# Patient Record
Sex: Female | Born: 1980
Health system: Southern US, Community
[De-identification: ages and names within clinical notes are randomized; demographics above are authoritative.]

## PROBLEM LIST (undated history)

## (undated) DIAGNOSIS — F32A Depression, unspecified: Secondary | ICD-10-CM

## (undated) DIAGNOSIS — K5190 Ulcerative colitis, unspecified, without complications: Secondary | ICD-10-CM

## (undated) DIAGNOSIS — F419 Anxiety disorder, unspecified: Secondary | ICD-10-CM

## (undated) DIAGNOSIS — F329 Major depressive disorder, single episode, unspecified: Secondary | ICD-10-CM

## (undated) HISTORY — PX: COLONOSCOPY: SHX174

## (undated) HISTORY — PX: OTHER SURGICAL HISTORY: SHX169

## (undated) HISTORY — PX: SIGMOIDOSCOPY: SUR1295

---

## 2013-10-22 LAB — LIPID PANEL
Cholesterol: 193 mg/dL (ref 0–200)
HDL: 42 mg/dL (ref 35–70)
LDL CALC: 138 mg/dL
TRIGLYCERIDES: 115 mg/dL (ref 40–160)

## 2013-11-02 DIAGNOSIS — IMO0002 Reserved for concepts with insufficient information to code with codable children: Secondary | ICD-10-CM | POA: Insufficient documentation

## 2013-11-02 DIAGNOSIS — E559 Vitamin D deficiency, unspecified: Secondary | ICD-10-CM | POA: Insufficient documentation

## 2014-07-11 ENCOUNTER — Encounter: Payer: Self-pay | Admitting: *Deleted

## 2014-07-11 ENCOUNTER — Emergency Department (INDEPENDENT_AMBULATORY_CARE_PROVIDER_SITE_OTHER)
Admission: EM | Admit: 2014-07-11 | Discharge: 2014-07-11 | Disposition: A | Discharge status: Home / Self Care | Payer: BC Managed Care – PPO | Source: Home / Self Care | Attending: Physician Assistant | Admitting: Physician Assistant

## 2014-07-11 DIAGNOSIS — N3000 Acute cystitis without hematuria: Secondary | ICD-10-CM

## 2014-07-11 HISTORY — DX: Ulcerative colitis, unspecified, without complications: K51.90

## 2014-07-11 LAB — POCT URINALYSIS DIP (MANUAL ENTRY)
Bilirubin, UA: NEGATIVE
Glucose, UA: NEGATIVE
Ketones, POC UA: NEGATIVE
NITRITE UA: NEGATIVE
PROTEIN UA: NEGATIVE
RBC UA: NEGATIVE
Spec Grav, UA: 1.02 (ref 1.005–1.03)
Urobilinogen, UA: 0.2 (ref 0–1)
pH, UA: 7.5 (ref 5–8)

## 2014-07-11 MED ORDER — CIPROFLOXACIN HCL 500 MG PO TABS: 500.0000 mg | ORAL_TABLET | Freq: Two times a day (BID) | ORAL | Status: DC

## 2014-07-11 MED ORDER — PHENAZOPYRIDINE HCL 200 MG PO TABS: 200.0000 mg | ORAL_TABLET | Freq: Three times a day (TID) | ORAL | Status: DC | PRN

## 2014-07-11 NOTE — ED Notes (Signed)
Pt c/o dysuria and urinary urgency x today. Denies fever or abd pain.

## 2014-07-11 NOTE — ED Provider Notes (Signed)
CSN: [id number]     Arrival date & time 07/11/14  [id number] History   First MD Initiated Contact with Patient 07/11/14 [phone]     Chief Complaint  Patient presents with  . Urinary Urgency  . Dysuria   (Consider location/radiation/quality/duration/timing/severity/associated sxs/prior Treatment) HPI  Pt presents to the clinic with dysuria and increased urinary urgency since this morning. Denies fever, chills, n/v/d. No abdominal or flank pain. Not tried anything to make better. [facility].   Past Medical History  Diagnosis Date  . Ulcerative colitis    Past Surgical History  Procedure Laterality Date  . Cyst in neck injected with radiation    . Colonoscopy    . Sigmoidoscopy     Family History  Problem Relation Age of Onset  . Rheum arthritis Mother   . Hyperlipidemia Father    History  Substance Use Topics  . Smoking status: Never Smoker   . Smokeless tobacco: Not on file  . Alcohol Use: Yes   OB History    No data available     Review of Systems  All other systems reviewed and are negative.   Allergies  Shellfish allergy  Home Medications   Prior to Admission medications   Medication Sig Start Date End Date Taking? Authorizing Provider  buPROPion (WELLBUTRIN SR) 150 MG 12 hr tablet Take 150 mg by mouth 2 (two) times daily.   Yes Historical Provider, MD  FLUoxetine (PROZAC) 40 MG capsule Take 40 mg by mouth daily.   Yes Historical Provider, MD  mesalamine (LIALDA) 1.2 G EC tablet Take by mouth daily with breakfast.   Yes Historical Provider, MD  UNKNOWN TO PATIENT    Yes Historical Provider, MD  ciprofloxacin (CIPRO) 500 MG tablet Take 1 tablet (500 mg total) by mouth 2 (two) times daily. For 3 days. 07/11/14   Ronniesha Seibold L Regan Llorente, PA-C  phenazopyridine (PYRIDIUM) 200 MG tablet Take 1 tablet (200 mg total) by mouth 3 (three) times daily as needed for pain. 07/11/14   Lakasha Mcfall L Jennefer Kopp, PA-C   BP 108/76 mmHg  Pulse 74   Temp(Src) 98.7 F (37.1 C) (Oral)  Resp 18  Ht 5\' 3"  (1.6 m)  Wt 182 lb (82.555 kg)  BMI 32.25 kg/m2  SpO2 98%  LMP 07/04/2014 Physical Exam  Constitutional: She is oriented to person, place, and time. She appears well-developed and well-nourished.  HENT:  Head: Normocephalic and atraumatic.  Cardiovascular: Normal rate, regular rhythm and normal heart sounds.   Pulmonary/Chest: Effort normal and breath sounds normal.  No CVA tenderness.   Abdominal: Soft. Bowel sounds are normal. She exhibits no distension and no mass. There is no tenderness. There is no rebound and no guarding.  Neurological: She is alert and oriented to person, place, and time.  Skin: Skin is dry.  Psychiatric: She has a normal mood and affect. Her behavior is normal.    ED Course  Procedures (including critical care time) Labs Review Labs Reviewed  URINE CULTURE  POCT URINALYSIS DIP (MANUAL ENTRY)    Imaging Review No results found.   MDM   1. Acute cystitis without hematuria    .[name]. Results for orders placed or performed during the hospital encounter of 07/11/14  POCT urinalysis dipstick (new)  Result Value Ref Range   Color, UA yellow    Clarity, UA clear    Glucose, UA neg    Bilirubin, UA negative    Bilirubin, UA negative    Spec  Grav, UA 1.020 1.005 - 1.03   Blood, UA negative    pH, UA 7.5 5 - 8   Protein Ur, POC negative    Urobilinogen, UA 0.2 0 - 1   Nitrite, UA Negative    Leukocytes, UA moderate (2+)    Will culture. No blood or nitrates, only leukocytes. Dicussed with patient could try pyridium, increased hydration and see if symptoms begin to resolve. If worsening given cipro for 3 days. Follow up as needed. HO given.     Jomarie LongsJade L Prestyn Mahn, PA-C 07/11/14 1011

## 2014-07-11 NOTE — Discharge Instructions (Signed)

## 2014-07-13 LAB — URINE CULTURE
Colony Count: NO GROWTH
Organism ID, Bacteria: NO GROWTH

## 2014-07-14 ENCOUNTER — Telehealth: Payer: Self-pay | Admitting: *Deleted

## 2014-08-04 ENCOUNTER — Encounter: Payer: Self-pay | Admitting: Physician Assistant

## 2014-08-04 ENCOUNTER — Ambulatory Visit (INDEPENDENT_AMBULATORY_CARE_PROVIDER_SITE_OTHER): Payer: 59 | Admitting: Physician Assistant

## 2014-08-04 VITALS — BP 104/72 | HR 70 | Ht 63.0 in | Wt 179.0 lb

## 2014-08-04 DIAGNOSIS — K5190 Ulcerative colitis, unspecified, without complications: Secondary | ICD-10-CM | POA: Insufficient documentation

## 2014-08-04 DIAGNOSIS — R221 Localized swelling, mass and lump, neck: Secondary | ICD-10-CM

## 2014-08-04 DIAGNOSIS — F329 Major depressive disorder, single episode, unspecified: Secondary | ICD-10-CM

## 2014-08-04 DIAGNOSIS — K51919 Ulcerative colitis, unspecified with unspecified complications: Secondary | ICD-10-CM

## 2014-08-04 DIAGNOSIS — F32A Depression, unspecified: Secondary | ICD-10-CM | POA: Insufficient documentation

## 2014-08-04 DIAGNOSIS — F411 Generalized anxiety disorder: Secondary | ICD-10-CM

## 2014-08-04 MED ORDER — FLUOXETINE HCL 40 MG PO CAPS: 40.0000 mg | ORAL_CAPSULE | Freq: Every day | ORAL | Status: DC

## 2014-08-04 MED ORDER — BUPROPION HCL ER (SR) 150 MG PO TB12: 150.0000 mg | ORAL_TABLET | Freq: Every day | ORAL | Status: DC

## 2014-08-04 MED ORDER — NORETHINDRONE ACET-ETHINYL EST 1-20 MG-MCG PO TABS
1.0000 | ORAL_TABLET | Freq: Every day | ORAL | Status: DC
Start: 2014-08-04 — End: 2014-08-04

## 2014-08-04 MED ORDER — NORETHINDRONE ACET-ETHINYL EST 1-20 MG-MCG PO TABS: 1.0000 | ORAL_TABLET | Freq: Every day | ORAL | Status: DC

## 2014-08-04 NOTE — Progress Notes (Signed)
   Subjective:    Patient ID: Adriana Frazier, female    DOB: 06-18-1981, 33 y.o.   MRN: [id number]  HPI Pt is a 33 yo female who presents to the clinic to establish care.   .. Active Ambulatory Problems    Diagnosis Date Noted  . Depression 08/04/2014  . Generalized anxiety disorder 08/04/2014  . Ulcerative colitis 08/04/2014  . Mass of left side of neck 08/04/2014   Resolved Ambulatory Problems    Diagnosis Date Noted  . No Resolved Ambulatory Problems   No Additional Past Medical History   .[name]. Family History  Problem Relation Age of Onset  . Rheum arthritis Mother   . Depression Mother   . Hyperlipidemia Father   . Bipolar disorder Father   . Bipolar disorder Sister   . Bipolar disorder Paternal Uncle    .[name]. History   Social History  . Marital Status: Married    Spouse Name: N/A    Number of Children: N/A  . Years of Education: N/A   Occupational History  . Not on file.   Social History Main Topics  . Smoking status: Never Smoker   . Smokeless tobacco: Not on file  . Alcohol Use: Yes  . Drug Use: No  . Sexual Activity: Yes   Other Topics Concern  . Not on file   Social History Narrative   Pt needs refills on medications. No problems or concerns. Symptoms controlled.    Review of Systems  All other systems reviewed and are negative.      Objective:   Physical Exam  Constitutional: She is oriented to person, place, and time. She appears well-developed and well-nourished.  HENT:  Head: Normocephalic and atraumatic.  Cardiovascular: Normal rate, regular rhythm and normal heart sounds.   Pulmonary/Chest: Effort normal and breath sounds normal.  Abdominal: Soft. Bowel sounds are normal.  Neurological: She is alert and oriented to person, place, and time.  Skin: Skin is dry.  Psychiatric: She has a normal mood and affect. Her behavior is normal.          Assessment & Plan:  Anxiety/depression- PHQ-9 was 1. GAD-7 was 0. Refilled wellbutrin and  prozac for 6 months.   Ulcerative colitis- managed by digestive health.   contraception management- switched from micronor since mini-pill and not as effective. Switched to [employer] Coloestrin. Needs cpe.

## 2014-10-30 ENCOUNTER — Encounter: Payer: Self-pay | Admitting: Physician Assistant

## 2015-02-07 ENCOUNTER — Other Ambulatory Visit (HOSPITAL_COMMUNITY)
Admission: RE | Admit: 2015-02-07 | Discharge: 2015-02-07 | Disposition: A | Discharge status: Home / Self Care | Payer: 59 | Source: Ambulatory Visit | Attending: Family Medicine | Admitting: Family Medicine

## 2015-02-07 ENCOUNTER — Encounter: Payer: Self-pay | Admitting: Family Medicine

## 2015-02-07 ENCOUNTER — Ambulatory Visit (INDEPENDENT_AMBULATORY_CARE_PROVIDER_SITE_OTHER): Payer: 59 | Admitting: Family Medicine

## 2015-02-07 VITALS — BP 100/68 | HR 62 | Ht 63.0 in | Wt 162.0 lb

## 2015-02-07 DIAGNOSIS — Z0000 Encounter for general adult medical examination without abnormal findings: Secondary | ICD-10-CM | POA: Diagnosis not present

## 2015-02-07 DIAGNOSIS — H6122 Impacted cerumen, left ear: Secondary | ICD-10-CM

## 2015-02-07 DIAGNOSIS — Z3009 Encounter for other general counseling and advice on contraception: Secondary | ICD-10-CM

## 2015-02-07 DIAGNOSIS — Z01419 Encounter for gynecological examination (general) (routine) without abnormal findings: Secondary | ICD-10-CM | POA: Diagnosis not present

## 2015-02-07 DIAGNOSIS — F411 Generalized anxiety disorder: Secondary | ICD-10-CM

## 2015-02-07 DIAGNOSIS — Z1151 Encounter for screening for human papillomavirus (HPV): Secondary | ICD-10-CM | POA: Insufficient documentation

## 2015-02-07 DIAGNOSIS — F329 Major depressive disorder, single episode, unspecified: Secondary | ICD-10-CM | POA: Diagnosis not present

## 2015-02-07 DIAGNOSIS — Z124 Encounter for screening for malignant neoplasm of cervix: Secondary | ICD-10-CM

## 2015-02-07 DIAGNOSIS — Z114 Encounter for screening for human immunodeficiency virus [HIV]: Secondary | ICD-10-CM | POA: Diagnosis not present

## 2015-02-07 DIAGNOSIS — F32A Depression, unspecified: Secondary | ICD-10-CM

## 2015-02-07 LAB — COMPLETE METABOLIC PANEL WITH GFR
ALBUMIN: 4.1 g/dL (ref 3.5–5.2)
ALT: 17 U/L (ref 0–35)
AST: 17 U/L (ref 0–37)
Alkaline Phosphatase: 45 U/L (ref 39–117)
BILIRUBIN TOTAL: 0.6 mg/dL (ref 0.2–1.2)
BUN: 8 mg/dL (ref 6–23)
CO2: 27 meq/L (ref 19–32)
Calcium: 9.3 mg/dL (ref 8.4–10.5)
Chloride: 103 mEq/L (ref 96–112)
Creat: 0.79 mg/dL (ref 0.50–1.10)
GFR, Est African American: >89 mL/min
GFR, Est Non African American: >89 mL/min
Glucose, Bld: 84 mg/dL (ref 70–99)
Potassium: 4 mEq/L (ref 3.5–5.3)
Sodium: 138 mEq/L (ref 135–145)
Total Protein: 6.9 g/dL (ref 6.0–8.3)

## 2015-02-07 LAB — LIPID PANEL
CHOL/HDL RATIO: 4.1 ratio
CHOLESTEROL: 239 mg/dL — AB (ref 0–200)
HDL: 58 mg/dL (ref >=46)
LDL CALC: 149 mg/dL — AB (ref 0–99)
Triglycerides: 161 mg/dL — ABNORMAL HIGH (ref <150)
VLDL: 32 mg/dL (ref 0–40)

## 2015-02-07 MED ORDER — FLUOXETINE HCL 40 MG PO CAPS: 40.0000 mg | ORAL_CAPSULE | Freq: Every day | ORAL | Status: DC

## 2015-02-07 MED ORDER — LEVONORGEST-ETH ESTRAD 91-DAY 0.15-0.03 MG PO TABS
1.0000 | ORAL_TABLET | Freq: Every day | ORAL | Status: DC
Start: 2015-02-07 — End: 2015-05-25

## 2015-02-07 MED ORDER — BUPROPION HCL ER (SR) 150 MG PO TB12: 150.0000 mg | ORAL_TABLET | Freq: Every day | ORAL | Status: DC

## 2015-02-07 NOTE — Patient Instructions (Signed)
Keep up a regular exercise program and make sure you are eating a healthy diet Try to eat 4 servings of dairy a day, or if you are lactose intolerant take a calcium with vitamin D daily.  Your vaccines are up to date.   
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
## 2015-02-08 LAB — HIV ANTIBODY (ROUTINE TESTING W REFLEX): HIV 1&2 Ab, 4th Generation: NONREACTIVE

## 2015-02-09 LAB — CYTOLOGY - PAP

## 2015-02-21 ENCOUNTER — Encounter: Payer: Self-pay | Admitting: Emergency Medicine

## 2015-02-21 ENCOUNTER — Emergency Department
Admission: EM | Admit: 2015-02-21 | Discharge: 2015-02-21 | Disposition: A | Discharge status: Home / Self Care | Payer: 59 | Source: Home / Self Care | Attending: Family Medicine | Admitting: Family Medicine

## 2015-02-21 DIAGNOSIS — W57XXXA Bitten or stung by nonvenomous insect and other nonvenomous arthropods, initial encounter: Secondary | ICD-10-CM | POA: Diagnosis not present

## 2015-02-21 DIAGNOSIS — T148 Other injury of unspecified body region: Secondary | ICD-10-CM | POA: Diagnosis not present

## 2015-02-21 DIAGNOSIS — L259 Unspecified contact dermatitis, unspecified cause: Secondary | ICD-10-CM

## 2015-02-21 MED ORDER — TRIAMCINOLONE ACETONIDE 0.1 % EX CREA: 1.0000 "application " | TOPICAL_CREAM | Freq: Two times a day (BID) | CUTANEOUS | Status: DC

## 2015-02-21 NOTE — ED Provider Notes (Signed)
CSN: [id number]     Arrival date & time 02/21/15  0848 History   First MD Initiated Contact with Patient 02/21/15 [phone]     Chief Complaint  Patient presents with  . Rash   (Consider location/radiation/quality/duration/timing/severity/associated sxs/prior Treatment) HPI  Pt is a 34yo female presenting to ED with c/o moderately pruritic rashes that started about 1 week ago after she was outside all day. Pt notes insect bites to her Left lower leg but also a larger rash on her Right forearm.  She has tried OTC creams with minimal relief. Denies fever, chills, n/v/d. No known allergies. Denies rash elsewhere. No contact with others with similar rash.  Past Medical History  Diagnosis Date  . Ulcerative colitis    Past Surgical History  Procedure Laterality Date  . Cyst in neck injected with radiation    . Colonoscopy    . Sigmoidoscopy     Family History  Problem Relation Age of Onset  . Rheum arthritis Mother   . Depression Mother   . Hyperlipidemia Father   . Bipolar disorder Father   . Bipolar disorder Sister   . Bipolar disorder Paternal Uncle   . Colon cancer      grandparents.    History  Substance Use Topics  . Smoking status: Never Smoker   . Smokeless tobacco: Not on file  . Alcohol Use: 0.0 oz/week    0 Standard drinks or equivalent per week     Comment: rarely.    OB History    No data available     Review of Systems  Constitutional: Negative for fever, chills, diaphoresis and fatigue.  Respiratory: Negative for cough, shortness of breath, wheezing and stridor.   Gastrointestinal: Negative for nausea, vomiting and abdominal pain.  Musculoskeletal: Negative for myalgias, joint swelling and arthralgias.  Skin: Positive for rash. Negative for color change, pallor and wound.  Neurological: Negative for weakness and numbness.    Allergies  Shellfish allergy  Home Medications   Prior to Admission medications   Medication Sig Start Date End Date Taking?  Authorizing Provider  buPROPion (WELLBUTRIN SR) 150 MG 12 hr tablet Take 1 tablet (150 mg total) by mouth daily. 02/07/15   Agapito Gamesatherine D Metheney, MD  Cholecalciferol (VITAMIN D-3) 1000 UNITS CAPS Take by mouth daily.    Historical Provider, MD  FLUoxetine (PROZAC) 40 MG capsule Take 1 capsule (40 mg total) by mouth daily. 02/07/15   Agapito Gamesatherine D Metheney, MD  levonorgestrel-ethinyl estradiol (SEASONALE,INTROVALE,JOLESSA) 0.15-0.03 MG tablet Take 1 tablet by mouth daily. 02/07/15   Agapito Gamesatherine D Metheney, MD  mesalamine (LIALDA) 1.2 G EC tablet Take by mouth daily with breakfast. 2 tabs    Historical Provider, MD  Multiple Vitamin (MULTIVITAMIN) tablet Take 1 tablet by mouth daily.    Historical Provider, MD  Omega 3-6-9 Fatty Acids (OMEGA 3-6-9 PO) Take 1,600 mg by mouth daily.    Historical Provider, MD  triamcinolone cream (KENALOG) 0.1 % Apply 1 application topically 2 (two) times daily. 02/21/15   Junius FinnerErin O'Malley, PA-C   BP 105/79 mmHg  Pulse 78  Temp(Src) 98.4 F (36.9 C) (Oral)  Ht 5\' 3"  (1.6 m)  Wt 162 lb (73.483 kg)  BMI 28.70 kg/m2  SpO2 97%  LMP 02/21/2015 Physical Exam  Constitutional: She is oriented to person, place, and time. She appears well-developed and well-nourished.  HENT:  Head: Normocephalic and atraumatic.  Eyes: EOM are normal.  Neck: Normal range of motion.  Cardiovascular: Normal rate.   Pulmonary/Chest: Effort  normal.  Musculoskeletal: Normal range of motion.  Neurological: She is alert and oriented to person, place, and time.  Skin: Skin is warm and dry. Rash noted. There is erythema.  Right forearm, anterior aspect: 2cm diffuse area of erythema and dried vesicles with scant yellow discharge. 1 lone circular 0.5cm dried erythematous lesion proximal to larger rash. No induration or fluctuance. No tenderness. Left lower leg: diffuse erythematous papules. No discharge or bleeding.   Psychiatric: She has a normal mood and affect. Her behavior is normal.  Nursing note  and vitals reviewed.   ED Course  Procedures (including critical care time) Labs Review Labs Reviewed - No data to display  Imaging Review No results found.   MDM   1. Contact dermatitis   2. Insect bites    Pt c/o pruritic rash to Right arm and Left lower leg. No systemic symptoms. Pt is afebrile. No respiratory distress. No evidence of anaphylaxis.  Pt c/o pruritic rash on Right arm and Left lower leg. Rash on Right arm is c/w contact dermatitis, no evidence of underlying infection. Rash on Left lower leg: c/w insect bites, w/o evidence of underlying infection.  No evidence of cellulitis or abscesses. Will prescribe triamcinolone cream as OTC medications not helping. Home care instructions provided. Advised to f/u with PCP in 1 week if symptoms not improving as she may need PO steroids and/or antibiotic if rash becomes infected. Return precautions provided. Pt verbalized understanding and agreement with tx plan.   Junius Finner, PA-C 02/21/15 1115

## 2015-02-21 NOTE — ED Notes (Signed)
Rash on right arm x 1 week Bug bites on lower left leg x 1 week

## 2015-05-25 ENCOUNTER — Ambulatory Visit (INDEPENDENT_AMBULATORY_CARE_PROVIDER_SITE_OTHER): Payer: 59 | Admitting: Advanced Practice Midwife

## 2015-05-25 ENCOUNTER — Encounter: Payer: Self-pay | Admitting: Advanced Practice Midwife

## 2015-05-25 VITALS — BP 95/62 | HR 72 | Wt 164.0 lb

## 2015-05-25 DIAGNOSIS — O09521 Supervision of elderly multigravida, first trimester: Secondary | ICD-10-CM

## 2015-05-25 DIAGNOSIS — Z113 Encounter for screening for infections with a predominantly sexual mode of transmission: Secondary | ICD-10-CM

## 2015-05-25 DIAGNOSIS — O09529 Supervision of elderly multigravida, unspecified trimester: Secondary | ICD-10-CM | POA: Insufficient documentation

## 2015-05-25 DIAGNOSIS — Z3481 Encounter for supervision of other normal pregnancy, first trimester: Secondary | ICD-10-CM | POA: Diagnosis not present

## 2015-05-25 DIAGNOSIS — Z3491 Encounter for supervision of normal pregnancy, unspecified, first trimester: Secondary | ICD-10-CM

## 2015-05-25 NOTE — Progress Notes (Signed)
BABYSCRIPTS PATIENT: [x ] initial,  12,  20,  28,  32,  36,  38,  39,  40 Bedside U/S shows IUP with FHT of 155 BPM and CRL 12.40mm GA [redacted]w[redacted]d

## 2015-05-25 NOTE — Patient Instructions (Signed)
First Trimester of Pregnancy The first trimester of pregnancy is from week 1 until the end of week 12 (months 1 through 3). A week after a sperm fertilizes an egg, the egg will implant on the wall of the uterus. This embryo will begin to develop into a baby. Genes from you and your partner are forming the baby. The female genes determine whether the baby is a boy or a girl. At 6-8 weeks, the eyes and face are formed, and the heartbeat can be seen on ultrasound. At the end of 12 weeks, all the baby's organs are formed.  Now that you are pregnant, you will want to do everything you can to have a healthy baby. Two of the most important things are to get good prenatal care and to follow your health care provider's instructions. Prenatal care is all the medical care you receive before the baby's birth. This care will help prevent, find, and treat any problems during the pregnancy and childbirth. BODY CHANGES Your body goes through many changes during pregnancy. The changes vary from woman to woman.   You may gain or lose a couple of pounds at first.  You may feel sick to your stomach (nauseous) and throw up (vomit). If the vomiting is uncontrollable, call your health care provider.  You may tire easily.  You may develop headaches that can be relieved by medicines approved by your health care provider.  You may urinate more often. Painful urination may mean you have a bladder infection.  You may develop heartburn as a result of your pregnancy.  You may develop constipation because certain hormones are causing the muscles that push waste through your intestines to slow down.  You may develop hemorrhoids or swollen, bulging veins (varicose veins).  Your breasts may begin to grow larger and become tender. Your nipples may stick out more, and the tissue that surrounds them (areola) may become darker.  Your gums may bleed and may be sensitive to brushing and flossing.  Dark spots or blotches (chloasma,  mask of pregnancy) may develop on your face. This will likely fade after the baby is born.  Your menstrual periods will stop.  You may have a loss of appetite.  You may develop cravings for certain kinds of food.  You may have changes in your emotions from day to day, such as being excited to be pregnant or being concerned that something may go wrong with the pregnancy and baby.  You may have more vivid and strange dreams.  You may have changes in your hair. These can include thickening of your hair, rapid growth, and changes in texture. Some women also have hair loss during or after pregnancy, or hair that feels dry or thin. Your hair will most likely return to normal after your baby is born. WHAT TO EXPECT AT YOUR PRENATAL VISITS During a routine prenatal visit:  You will be weighed to make sure you and the baby are growing normally.  Your blood pressure will be taken.  Your abdomen will be measured to track your baby's growth.  The fetal heartbeat will be listened to starting around week 10 or 12 of your pregnancy.  Test results from any previous visits will be discussed. Your health care provider may ask you:  How you are feeling.  If you are feeling the baby move.  If you have had any abnormal symptoms, such as leaking fluid, bleeding, severe headaches, or abdominal cramping.  If you are using any tobacco products,   including cigarettes, chewing tobacco, and electronic cigarettes.  If you have any questions. Other tests that may be performed during your first trimester include:  Blood tests to find your blood type and to check for the presence of any previous infections. They will also be used to check for low iron levels (anemia) and Rh antibodies. Later in the pregnancy, blood tests for diabetes will be done along with other tests if problems develop.  Urine tests to check for infections, diabetes, or protein in the urine.  An ultrasound to confirm the proper growth  and development of the baby.  An amniocentesis to check for possible genetic problems.  Fetal screens for spina bifida and Down syndrome.  You may need other tests to make sure you and the baby are doing well.  HIV (human immunodeficiency virus) testing. Routine prenatal testing includes screening for HIV, unless you choose not to have this test. HOME CARE INSTRUCTIONS  Medicines  Follow your health care provider's instructions regarding medicine use. Specific medicines may be either safe or unsafe to take during pregnancy.  Take your prenatal vitamins as directed.  If you develop constipation, try taking a stool softener if your health care provider approves. Diet  Eat regular, well-balanced meals. Choose a variety of foods, such as meat or vegetable-based protein, fish, milk and low-fat dairy products, vegetables, fruits, and whole grain breads and cereals. Your health care provider will help you determine the amount of weight gain that is right for you.  Avoid raw meat and uncooked cheese. These carry germs that can cause birth defects in the baby.  Eating four or five small meals rather than three large meals a day may help relieve nausea and vomiting. If you start to feel nauseous, eating a few soda crackers can be helpful. Drinking liquids between meals instead of during meals also seems to help nausea and vomiting.  If you develop constipation, eat more high-fiber foods, such as fresh vegetables or fruit and whole grains. Drink enough fluids to keep your urine clear or pale yellow. Activity and Exercise  Exercise only as directed by your health care provider. Exercising will help you:  Control your weight.  Stay in shape.  Be prepared for labor and delivery.  Experiencing pain or cramping in the lower abdomen or low back is a good sign that you should stop exercising. Check with your health care provider before continuing normal exercises.  Try to avoid standing for long  periods of time. Move your legs often if you must stand in one place for a long time.  Avoid heavy lifting.  Wear low-heeled shoes, and practice good posture.  You may continue to have sex unless your health care provider directs you otherwise. Relief of Pain or Discomfort  Wear a good support bra for breast tenderness.   Take warm sitz baths to soothe any pain or discomfort caused by hemorrhoids. Use hemorrhoid cream if your health care provider approves.   Rest with your legs elevated if you have leg cramps or low back pain.  If you develop varicose veins in your legs, wear support hose. Elevate your feet for 15 minutes, 3-4 times a day. Limit salt in your diet. Prenatal Care  Schedule your prenatal visits by the twelfth week of pregnancy. They are usually scheduled monthly at first, then more often in the last 2 months before delivery.  Write down your questions. Take them to your prenatal visits.  Keep all your prenatal visits as directed by your   health care provider. Safety  Wear your seat belt at all times when driving.  Make a list of emergency phone numbers, including numbers for family, friends, the hospital, and police and fire departments. General Tips  Ask your health care provider for a referral to a local prenatal education class. Begin classes no later than at the beginning of month 6 of your pregnancy.  Ask for help if you have counseling or nutritional needs during pregnancy. Your health care provider can offer advice or refer you to specialists for help with various needs.  Do not use hot tubs, steam rooms, or saunas.  Do not douche or use tampons or scented sanitary pads.  Do not cross your legs for long periods of time.  Avoid cat litter boxes and soil used by cats. These carry germs that can cause birth defects in the baby and possibly loss of the fetus by miscarriage or stillbirth.  Avoid all smoking, herbs, alcohol, and medicines not prescribed by  your health care provider. Chemicals in these affect the formation and growth of the baby.  Do not use any tobacco products, including cigarettes, chewing tobacco, and electronic cigarettes. If you need help quitting, ask your health care provider. You may receive counseling support and other resources to help you quit.  Schedule a dentist appointment. At home, brush your teeth with a soft toothbrush and be gentle when you floss. SEEK MEDICAL CARE IF:   You have dizziness.  You have mild pelvic cramps, pelvic pressure, or nagging pain in the abdominal area.  You have persistent nausea, vomiting, or diarrhea.  You have a bad smelling vaginal discharge.  You have pain with urination.  You notice increased swelling in your face, hands, legs, or ankles. SEEK IMMEDIATE MEDICAL CARE IF:   You have a fever.  You are leaking fluid from your vagina.  You have spotting or bleeding from your vagina.  You have severe abdominal cramping or pain.  You have rapid weight gain or loss.  You vomit blood or material that looks like coffee grounds.  You are exposed to German measles and have never had them.  You are exposed to fifth disease or chickenpox.  You develop a severe headache.  You have shortness of breath.  You have any kind of trauma, such as from a fall or a car accident.   This information is not intended to replace advice given to you by your health care provider. Make sure you discuss any questions you have with your health care provider.   Document Released: 07/29/2001 Document Revised: 08/25/2014 Document Reviewed: 06/14/2013 Elsevier Interactive Patient Education 2016 Elsevier Inc.  

## 2015-05-25 NOTE — Progress Notes (Signed)
New OB. See smartset  Subjective:    Adriana Frazier is a G2P1001 [redacted]w[redacted]d being seen today for her first obstetrical visit.  Her obstetrical history is significant for advanced maternal age. Patient does intend to breast feed. Pregnancy history fully reviewed.  Lives in [place], works downstairs as a Paramedic.  Wants to do the [employer].  Patient reports nausea.  Filed Vitals:   05/25/15 1001  BP: 95/62  Pulse: 72  Weight: 74.39 kg (164 lb)    HISTORY: OB History  Gravida Para Term Preterm AB SAB TAB Ectopic Multiple Living  # Outcome Date GA Lbr Len/2nd Weight Sex Delivery Anes PTL Lv  2 Current           1 Term 05/24/12   2.807 kg (6 lb 3 oz) F Vag-Spont EPI       Past Medical History  Diagnosis Date  . Ulcerative colitis [facility])    Past Surgical History  Procedure Laterality Date  . Cyst in neck injected with radiation    . Colonoscopy    . Sigmoidoscopy     Family History  Problem Relation Age of Onset  . Rheum arthritis Mother   . Depression Mother   . Hyperlipidemia Father   . Bipolar disorder Father   . Bipolar disorder Sister   . Bipolar disorder Paternal Uncle   . Colon cancer      grandparents.      Exam    Uterus:  Fundal Height: 7 cm  Pelvic Exam:    Perineum: No Hemorrhoids   Vulva: Bartholin's, Urethra, Skene's normal   Vagina:  normal mucosa, normal discharge   pH:    Cervix: nulliparous appearance   Adnexa: no mass, fullness, tenderness   Bony Pelvis: gynecoid  System: Breast:  normal appearance, no masses or tenderness   Skin: normal coloration and turgor, no rashes    Neurologic: oriented, grossly non-focal   Extremities: normal strength, tone, and muscle mass   HEENT neck supple with midline trachea   Mouth/Teeth mucous membranes moist, pharynx normal without lesions   Neck supple   Cardiovascular: regular rate and rhythm, no murmurs or gallops   Respiratory:  appears well, vitals normal, no respiratory  distress, acyanotic, normal RR, ear and throat exam is normal, neck free of mass or lymphadenopathy, chest clear, no wheezing, crepitations, rhonchi, normal symmetric air entry   Abdomen: soft, non-tender; bowel sounds normal; no masses,  no organomegaly   Urinary: urethral meatus normal      Assessment:    Pregnancy: G2P1001 Patient Active Problem List   Diagnosis Date Noted  . Depression 08/04/2014  . Generalized anxiety disorder 08/04/2014  . Ulcerative colitis (HCC) 08/04/2014  . Mass of left side of neck 08/04/2014        Plan:     Initial labs drawn. Prenatal vitamins. Problem list reviewed and updated. Genetic Screening discussed First Screen and Integrated Screen: Reviewed first screen and Panorama, Undecided.  Ultrasound discussed; fetal survey: results reviewed.  Follow up in 4 weeks. 50% of 30 min visit spent on counseling and coordination of care.   Routines reviewed   Discussed [facility] Discussed waterbirth, pregnancy monitoring, normal first trimester issues. Offered Diclegis, states nausea is not that bad.    [facility] 05/25/2015

## 2015-05-26 LAB — HIV ANTIBODY (ROUTINE TESTING W REFLEX): HIV: NONREACTIVE

## 2015-05-28 ENCOUNTER — Other Ambulatory Visit: Payer: Self-pay | Admitting: Advanced Practice Midwife

## 2015-05-28 LAB — OBSTETRIC PANEL
ANTIBODY SCREEN: NEGATIVE
BASOS PCT: 0 % (ref 0–1)
Basophils Absolute: 0 10*3/uL (ref 0.0–0.1)
EOS ABS: 0.2 10*3/uL (ref 0.0–0.7)
EOS PCT: 2 % (ref 0–5)
HEMATOCRIT: 42.8 % (ref 36.0–46.0)
HEMOGLOBIN: 13.9 g/dL (ref 12.0–15.0)
Hepatitis B Surface Ag: NEGATIVE
LYMPHS ABS: 1.6 10*3/uL (ref 0.7–4.0)
Lymphocytes Relative: 20 % (ref 12–46)
MCH: 29.8 pg (ref 26.0–34.0)
MCHC: 32.5 g/dL (ref 30.0–36.0)
MCV: 91.6 fL (ref 78.0–100.0)
MONO ABS: 0.5 10*3/uL (ref 0.1–1.0)
MONOS PCT: 6 % (ref 3–12)
MPV: 9.5 fL (ref 8.6–12.4)
Neutro Abs: 5.8 10*3/uL (ref 1.7–7.7)
Neutrophils Relative %: 72 % (ref 43–77)
Platelets: 276 10*3/uL (ref 150–400)
RBC: 4.67 MIL/uL (ref 3.87–5.11)
RDW: 13.7 % (ref 11.5–15.5)
RH TYPE: POSITIVE
RUBELLA: 1.63 {index} — AB (ref <0.90)
WBC: 8.1 10*3/uL (ref 4.0–10.5)

## 2015-05-28 LAB — CULTURE, URINE COMPREHENSIVE: Colony Count: 50000

## 2015-05-28 LAB — URINE CYTOLOGY ANCILLARY ONLY
Chlamydia: NEGATIVE
Neisseria Gonorrhea: NEGATIVE

## 2015-05-29 ENCOUNTER — Telehealth: Payer: Self-pay | Admitting: *Deleted

## 2015-05-29 DIAGNOSIS — N390 Urinary tract infection, site not specified: Secondary | ICD-10-CM

## 2015-05-29 MED ORDER — CEPHALEXIN 500 MG PO CAPS: 500.0000 mg | ORAL_CAPSULE | Freq: Four times a day (QID) | ORAL | Status: DC

## 2015-05-29 NOTE — Telephone Encounter (Signed)
-----   Message from Aviva Signs, CNM sent at 05/28/2015  4:17 PM EDT ----- Will prescribe either Keflex or Macrobid. Resistant to Amp.

## 2015-05-29 NOTE — Telephone Encounter (Signed)
Pt notified of urine culture results and RX sent to [facility] for antibiotic

## 2015-06-06 ENCOUNTER — Encounter: Payer: Self-pay | Admitting: Advanced Practice Midwife

## 2015-06-08 ENCOUNTER — Ambulatory Visit (INDEPENDENT_AMBULATORY_CARE_PROVIDER_SITE_OTHER): Payer: 59 | Admitting: Family

## 2015-06-08 VITALS — BP 99/70 | HR 75 | Temp 97.9°F | Wt 160.0 lb

## 2015-06-08 DIAGNOSIS — B3731 Acute candidiasis of vulva and vagina: Secondary | ICD-10-CM

## 2015-06-08 DIAGNOSIS — Z331 Pregnant state, incidental: Secondary | ICD-10-CM

## 2015-06-08 DIAGNOSIS — B373 Candidiasis of vulva and vagina: Secondary | ICD-10-CM

## 2015-06-08 DIAGNOSIS — O09521 Supervision of elderly multigravida, first trimester: Secondary | ICD-10-CM

## 2015-06-08 MED ORDER — TERCONAZOLE 0.4 % VA CREA: 1.0000 | TOPICAL_CREAM | Freq: Every day | VAGINAL | Status: DC

## 2015-06-08 NOTE — Progress Notes (Signed)
Pt here for vaginal itching.  She just finished up Keflex for UTI

## 2015-06-08 NOTE — Progress Notes (Signed)
Subjective:  Adriana PeaSarah Frazier is a 34 y.o. G2P1001 at 1084w4d being seen today for vaginal itching after finishing antibiotic.   Vag. Bleeding: None.  The following portions of the patient's history were reviewed and updated as appropriate: allergies, current medications, past family history, past medical history, past social history, past surgical history and problem list. Problem list updated.  Objective:   Filed Vitals:   06/08/15 0817  BP: 99/70  Pulse: 75  Temp: 97.9 F (36.6 C)  Weight: 160 lb (72.576 kg)    Fetal Status: Fetal Heart Rate (bpm): 178   Movement: Absent     General:  Alert, oriented and cooperative. Patient is in no acute distress.  Skin: Skin is warm and dry. No rash noted.   Cardiovascular: Normal heart rate noted  Respiratory: Normal respiratory effort, no problems with respiration noted  Abdomen: Soft, gravid, appropriate for gestational age. Pain/Pressure: Absent     Pelvic: Vag. Bleeding: None Vag D/C Character: Thin; vaginal area red, white discharge Cervical exam deferred        Extremities: Normal range of motion.  Edema: None  Mental Status: Normal mood and affect. Normal behavior. Normal judgment and thought content.   Urinalysis: Urine Protein: Negative Urine Glucose: Negative  Assessment and Plan:  Pregnancy: G2P1001 at 624w4d  1. AMA (advanced maternal age) multigravida 35+, first trimester - Keep scheduled follow up OB appt.  2. Vaginal yeast infection - WET PREP FOR TRICH, YEAST, CLUE - RX Terazol   Please refer to After Visit Summary for other counseling recommendations.    Eino FarberWalidah Kennith GainN Karim, CNM

## 2015-06-12 LAB — WET PREP, GENITAL
Clue Cells Wet Prep HPF POC: NONE SEEN
TRICH WET PREP: NONE SEEN
WBC WET PREP: NONE SEEN

## 2015-06-25 ENCOUNTER — Ambulatory Visit (INDEPENDENT_AMBULATORY_CARE_PROVIDER_SITE_OTHER): Payer: 59 | Admitting: Advanced Practice Midwife

## 2015-06-25 VITALS — BP 99/65 | HR 79 | Wt 162.0 lb

## 2015-06-25 DIAGNOSIS — O09521 Supervision of elderly multigravida, first trimester: Secondary | ICD-10-CM

## 2015-06-25 DIAGNOSIS — O09891 Supervision of other high risk pregnancies, first trimester: Secondary | ICD-10-CM

## 2015-06-25 NOTE — Progress Notes (Signed)
Subjective:  Adriana PeaSarah Frazier is a 34 y.o. G2P1001 at 8584w0d being seen today for ongoing prenatal care.  Patient reports rash on upper chest. Improving w/ out Tx. Denies any SOB, swelling of lips or toungue. .  Contractions: Not present.  Vag. Bleeding: None. Movement: Absent. Denies leaking of fluid.   The following portions of the patient's history were reviewed and updated as appropriate: allergies, current medications, past family history, past medical history, past social history, past surgical history and problem list. Problem list updated.  Objective:   Filed Vitals:   06/25/15 0827  BP: 99/65  Pulse: 79  Weight: 162 lb (73.483 kg)    Fetal Status: Fetal Heart Rate (bpm): 157   Movement: Absent     General:  Alert, oriented and cooperative. Patient is in no acute distress.  Skin: Skin is warm and dry. No rash noted.   Cardiovascular: Normal heart rate noted  Respiratory: Normal respiratory effort, no problems with respiration noted  Abdomen: Soft, gravid, appropriate for gestational age. Pain/Pressure: Absent     Pelvic: Vag. Bleeding: None Vag D/C Character: Thin   Cervical exam deferred        Extremities: Normal range of motion.  Edema: Trace  Mental Status: Normal mood and affect. Normal behavior. Normal judgment and thought content.   Urinalysis: Urine Protein: Negative Urine Glucose: Negative  Assessment and Plan:  Pregnancy: G2P1001 at 5984w0d  1. AMA (advanced maternal age) multigravida 35+, first trimester -Harmony drawn -Declined first trimester screen - US MFM OB COMP + 14 WK; Future  2. Supervision of other high risk pregnancies, first trimester  - US MFM OB COMP + 14 WK; Future  3. Ultrasound to check fetal anatomy and growth in pregnancy, antepartum  - US MFM OB COMP + 14 WK; Future  First trimester obstetric precautions including but not limited to vaginal bleeding, contractions, leaking of fluid and fetal movement were reviewed in detail with the  patient. Please refer to After Visit Summary for other counseling recommendations.  Return in about 8 weeks (around 08/20/2015).   Dorathy KinsmanVirginia Keriann Rankin, CNM

## 2015-06-25 NOTE — Progress Notes (Signed)
Noticed an itchy type rash on chest over the weekend

## 2015-06-25 NOTE — Patient Instructions (Signed)

## 2015-07-02 LAB — GENETIC SCREENING
TRISOMY 21 (T21): LOW
Trisomy 13 (T13): LOW
Trisomy 18 (T18): LOW

## 2015-07-02 NOTE — Addendum Note (Signed)
Addended by: Arne ClevelandHUTCHINSON, MANDY J on: 07/02/2015 04:36 PM   Modules accepted: Orders

## 2015-07-18 ENCOUNTER — Telehealth: Payer: 59 | Admitting: Family

## 2015-07-18 ENCOUNTER — Ambulatory Visit (INDEPENDENT_AMBULATORY_CARE_PROVIDER_SITE_OTHER): Payer: 59 | Admitting: Osteopathic Medicine

## 2015-07-18 ENCOUNTER — Encounter: Payer: Self-pay | Admitting: Osteopathic Medicine

## 2015-07-18 VITALS — BP 104/73 | HR 80 | Ht 63.0 in | Wt 161.0 lb

## 2015-07-18 DIAGNOSIS — O2341 Unspecified infection of urinary tract in pregnancy, first trimester: Secondary | ICD-10-CM

## 2015-07-18 DIAGNOSIS — R350 Frequency of micturition: Secondary | ICD-10-CM

## 2015-07-18 LAB — POCT URINALYSIS DIPSTICK
BILIRUBIN UA: NEGATIVE
BLOOD: NEGATIVE
GLUCOSE UA: NEGATIVE
Ketones, UA: NEGATIVE
NITRITE UA: NEGATIVE
PH UA: 6.5
Protein, UA: NEGATIVE
SPEC GRAV UA: 1.025
Urobilinogen, UA: 0.2

## 2015-07-18 MED ORDER — NITROFURANTOIN MONOHYD MACRO 100 MG PO CAPS: 100.0000 mg | ORAL_CAPSULE | Freq: Two times a day (BID) | ORAL | Status: DC

## 2015-07-18 NOTE — Patient Instructions (Signed)
We will call you if we need to change antibiotics based on urine culture results. If you have another urinary tract infection, or if her symptoms do not resolve, please let us know or talk to your OB/GYN. Please contact the office if you have any questions. Please finish ALL antibiotics even if you are feeling better!

## 2015-07-18 NOTE — Progress Notes (Signed)
Based on what you shared with me it looks like you have a serious condition that should be evaluated in a face to face office visit. Since you are pregnant you need to go see your primary care provider and have a urine test to rule out an infection. I do not want to give you an unnecessary antibiotic since you are pregnant.   If you are having a true medical emergency please call 911.  If you need an urgent face to face visit, [facility] has four urgent care centers for your convenience.  Adriana Frazier. [facility]  [phone] Get Driving Directions Find a Provider at this Location  [address] [place], [place] [address] . 8 am to 8 pm Monday-Friday . 9 am to 7 pm Saturday-Sunday  . [facility]  [phone] Get Driving Directions Find a Provider at this Location  [address] [place], [place] [address] . 8 am to 8 pm Monday-Friday . 9 am to 6 pm Saturday . 11 am to 6 pm Sunday   . [facility] [place]  [phone] Get Driving Directions  [address] [place], [place] [address] . 8 am to 8 pm Monday-Friday . 9 am to 4 pm Saturday-Sunday   . [facility] (a walk in primary care provider)  [phone]  Get Driving Directions Find a Provider at this Location  [address] [place], [place] [address] . 8 am to 8:30 pm Monday-Thursday . 8 am to 6 pm Friday . 8 am to 4 pm Saturday-Sunday   Your e-visit answers were reviewed by a board certified advanced clinical practitioner to complete your personal care plan.  Thank you for using e-Visits.

## 2015-07-18 NOTE — Progress Notes (Signed)
Chief Complaint: Possible UTI  History of Present Illness: Adriana Frazier is a 34 y.o. female who presents to [facility]  today with concerns for acute urinary tract infection  Onset: 2 days ago  Quality: Burning/Urgency Exacerbating factors:   Frequency: yes  Hematuria: no  Odor: no  Fever/chills: no  Incontinence: no  Flank Pain: no Previous UTI: treated for asymptomatic bacteriuria in pregnancy, other last  Recurrent UTI (3 times/more annually): no Abx in past 3 months: yes  Complicated (any of the following): Yes ?Diabetes ?Pregnancy ?Symptoms for seven or more days before seeking care ?Hospital acquired infection ?Renal failure ?Urinary tract obstruction ?Presence of an indwelling urethral catheter, stent, nephrostomy tube or urinary diversion ?Functional or anatomic abnormality of the urinary tract ?Renal transplantation ?Immunosuppression   Past medical, social and family history reviewed: Past Medical History  Diagnosis Date  . Ulcerative colitis [facility])    Past Surgical History  Procedure Laterality Date  . Cyst in neck injected with radiation    . Colonoscopy    . Sigmoidoscopy     Social History  Substance Use Topics  . Smoking status: Never Smoker   . Smokeless tobacco: Never Used  . Alcohol Use: 0.0 oz/week    0 Standard drinks or equivalent per week     Comment: rarely.    The patient has a family history of  Current Outpatient Prescriptions  Medication Sig Dispense Refill  . buPROPion (WELLBUTRIN SR) 150 MG 12 hr tablet Take 1 tablet (150 mg total) by mouth daily. 90 tablet 1  . Cholecalciferol (VITAMIN D-3) 1000 UNITS CAPS Take by mouth daily.    [name] FLUoxetine (PROZAC) 40 MG capsule Take 1 capsule (40 mg total) by mouth daily. 90 capsule 1  . mesalamine (LIALDA) 1.2 G EC tablet Take by mouth daily with breakfast. 2 tabs    . Omega 3-6-9 Fatty Acids (OMEGA 3-6-9 PO) Take 1,600 mg by mouth daily.    . Prenatal  Vit-Fe Fumarate-FA (PRENATAL VITAMIN PO) Take by mouth.     No current facility-administered medications for this visit.   Allergies  Allergen Reactions  . Shellfish Allergy      Review of Systems: CONSTITUTIONAL: Negative fever/chills CARDIAC: No chest pain/pressure/palpitations, no orthopnea RESPIRATORY: No cough/shortness of breath/wheeze GASTROINTESTINAL: No nausea/vomiting/abdominal pain/blood in stool/diarrhea/constipation MUSCULOSKELETAL: No myalgia/arthralgia/back pain GENITOURINARY: Negative incontinence, Negative abnormal genital bleeding/discharge. (+)Dysuria/UTI symptoms as per HPI   Exam:  Filed Vitals:   07/18/15 1111  Height:  (1.6 m)  Weight: 161 lb (73.029 kg)   Constitutional: VSS, see above. General Appearance: alert, well-developed, well-nourished, NAD Respiratory: Normal respiratory effort. Breath sounds normal, no wheeze/rhonchi/rales Cardiovascular: S1/S2 normal, no murmur/rub/gallop auscultated. RRR Gastrointestinal: Nontender, no masses. No hepatomegaly, no splenomegaly. No hernia appreciated. Rectal exam deferred.  Musculoskeletal: Gait normal. No clubbing/cyanosis of digits. Lloyd sign Negative bilateral  Results for orders placed or performed in visit on 07/18/15 (from the past 48 hour(s))  POCT Urinalysis Dipstick     Status: Abnormal   Collection Time: 07/18/15 11:09 AM  Result Value Ref Range   Color, UA YELLOW    Clarity, UA CLEAR    Glucose, UA NEG    Bilirubin, UA NEG    Ketones, UA NEG    Spec Grav, UA 1.025    Blood NEG    pH, UA 6.5    Protein, UA NEG    Urobilinogen, UA 0.2    Nitrite, UA NEG    Leukocytes, UA small (  1+) (A) Negative    Reviewed previous culture results done for pregnancy screening: treated for E.Coli 50K colonies/mL w/ Keflex  ASSESSMENT/PLAN: Urinary tract infection in pregnancy and, patient states this is her second UTI this year, previously treated for asymptomatic bacteriuria when Escherichia coli  found on screening urine culture done as part of routine prenatal care. Patient advised to call in antibiotics even if feeling better. Please let us for her OB/GYN know if she is not improving or gets another urinary tract infection . If recurs, may consider prophylactic antibiotics.   UTI in pregnancy, first trimester - Plan: nitrofurantoin, macrocrystal-monohydrate, (MACROBID) 100 MG capsule  Frequency of urination - Plan: POCT Urinalysis Dipstick   Return if symptoms worsen or fail to improve, and routine prenatal care with OBGYN.

## 2015-07-21 LAB — URINE CULTURE

## 2015-07-26 ENCOUNTER — Encounter: Payer: Self-pay | Admitting: Obstetrics & Gynecology

## 2015-08-06 ENCOUNTER — Encounter: Payer: Self-pay | Admitting: Physician Assistant

## 2015-08-06 ENCOUNTER — Ambulatory Visit (INDEPENDENT_AMBULATORY_CARE_PROVIDER_SITE_OTHER): Payer: 59 | Admitting: Physician Assistant

## 2015-08-06 VITALS — BP 96/61 | HR 77 | Ht 63.0 in | Wt 163.0 lb

## 2015-08-06 DIAGNOSIS — F329 Major depressive disorder, single episode, unspecified: Secondary | ICD-10-CM | POA: Diagnosis not present

## 2015-08-06 DIAGNOSIS — F32A Depression, unspecified: Secondary | ICD-10-CM

## 2015-08-06 DIAGNOSIS — F411 Generalized anxiety disorder: Secondary | ICD-10-CM | POA: Diagnosis not present

## 2015-08-06 MED ORDER — FLUOXETINE HCL 40 MG PO CAPS: 40.0000 mg | ORAL_CAPSULE | Freq: Every day | ORAL | Status: DC

## 2015-08-06 MED ORDER — BUPROPION HCL ER (SR) 150 MG PO TB12: 150.0000 mg | ORAL_TABLET | Freq: Every day | ORAL | Status: DC

## 2015-08-06 NOTE — Progress Notes (Signed)
   Subjective:    Patient ID: Dominic PeaSarah Moretto, female    DOB: Apr 14, 1981, 34 y.o.   MRN: [id number]  HPI Patient is a 34 year old female who presents to the clinic for follow-up on anxiety and depression. Patient is [redacted] weeks pregnant with no complications. She sees womens next door.  She has not changed any of her medication. She feels wonderful. She denies any anxiety or depression that is worsening. She feels very controlled. She denies any suicidal or homicidal thoughts.   Review of Systems  All other systems reviewed and are negative.      Objective:   Physical Exam  Constitutional: She is oriented to person, place, and time. She appears well-developed and well-nourished.  HENT:  Head: Normocephalic and atraumatic.  Cardiovascular: Normal rate, regular rhythm and normal heart sounds.   Neurological: She is alert and oriented to person, place, and time.  Psychiatric: She has a normal mood and affect. Her behavior is normal.          Assessment & Plan:  GAD/Depression- GAD-7 was 1. PHQ-9 was 1. Refilled medications for 1 year. Last cmp was 6 months ago and great. Follow up for CPE.

## 2015-08-10 ENCOUNTER — Ambulatory Visit: Payer: 59 | Admitting: Physician Assistant

## 2015-08-10 ENCOUNTER — Other Ambulatory Visit: Payer: Self-pay | Admitting: Advanced Practice Midwife

## 2015-08-10 ENCOUNTER — Ambulatory Visit (HOSPITAL_COMMUNITY)
Admission: RE | Admit: 2015-08-10 | Discharge: 2015-08-10 | Disposition: A | Discharge status: Home / Self Care | Payer: 59 | Source: Ambulatory Visit | Attending: Advanced Practice Midwife | Admitting: Advanced Practice Midwife

## 2015-08-10 DIAGNOSIS — Z36 Encounter for antenatal screening of mother: Secondary | ICD-10-CM | POA: Diagnosis not present

## 2015-08-10 DIAGNOSIS — Z3689 Encounter for other specified antenatal screening: Secondary | ICD-10-CM

## 2015-08-10 DIAGNOSIS — O09521 Supervision of elderly multigravida, first trimester: Secondary | ICD-10-CM

## 2015-08-10 DIAGNOSIS — Z3A18 18 weeks gestation of pregnancy: Secondary | ICD-10-CM | POA: Diagnosis not present

## 2015-08-10 DIAGNOSIS — O09522 Supervision of elderly multigravida, second trimester: Secondary | ICD-10-CM

## 2015-08-10 DIAGNOSIS — O09891 Supervision of other high risk pregnancies, first trimester: Secondary | ICD-10-CM

## 2015-08-10 DIAGNOSIS — O4450 Low lying placenta with hemorrhage, unspecified trimester: Secondary | ICD-10-CM

## 2015-08-11 DIAGNOSIS — O4450 Low lying placenta with hemorrhage, unspecified trimester: Secondary | ICD-10-CM | POA: Insufficient documentation

## 2015-08-15 ENCOUNTER — Telehealth: Payer: Self-pay | Admitting: *Deleted

## 2015-08-15 NOTE — Telephone Encounter (Signed)
-----   Message from [place], [place] sent at 08/11/2015  3:27 PM EST ----- Please inform of low lying placenta. Pelvic rest until resolved.

## 2015-08-15 NOTE — Telephone Encounter (Signed)
LM on voicemail with a f/u on latest OB u/s.  The u/s showed a low lying placenta and encourage pelvic rest until resolved.  This includes no intercourse and no lifting over 5-10 lbs.

## 2015-08-19 NOTE — L&D Delivery Note (Signed)
Delivery Note At 08:59, a viable female was delivered via precipitous NSVD.  RN called that patient was 6cm and desired epidural and approximately 10 minutes later called that patient was pushing.  Infant was delivered without complication prior to MD arrival.  APGAR: 8, 9; weight 2945g. Placenta status: intact, spontaneous.  Cord: 3-vessel with the following complications: none.  Cord pH: not obtained   Anesthesia: none for delivery, local lidocaine for repair  Episiotomy: none Lacerations: right periurethral Suture Repair: 4-0 SH Est. Blood Loss (mL): 250cc  Mom to postpartum.  Baby to Couplet care / Skin to Skin.  Adriana Frazier 12/12/2015, 9:42 AM

## 2015-08-22 ENCOUNTER — Ambulatory Visit (INDEPENDENT_AMBULATORY_CARE_PROVIDER_SITE_OTHER): Payer: 59 | Admitting: Obstetrics & Gynecology

## 2015-08-22 VITALS — BP 97/63 | HR 82 | Wt 167.0 lb

## 2015-08-22 DIAGNOSIS — Z36 Encounter for antenatal screening of mother: Secondary | ICD-10-CM

## 2015-08-22 DIAGNOSIS — Z3492 Encounter for supervision of normal pregnancy, unspecified, second trimester: Secondary | ICD-10-CM

## 2015-08-22 DIAGNOSIS — Z3482 Encounter for supervision of other normal pregnancy, second trimester: Secondary | ICD-10-CM

## 2015-08-22 DIAGNOSIS — Z3480 Encounter for supervision of other normal pregnancy, unspecified trimester: Secondary | ICD-10-CM | POA: Diagnosis not present

## 2015-08-22 DIAGNOSIS — O4442 Low lying placenta NOS or without hemorrhage, second trimester: Secondary | ICD-10-CM

## 2015-08-22 NOTE — Progress Notes (Signed)
Subjective:  Adriana Frazier is a 35 y.o. G2P1001 at 3569w2d being seen today for ongoing prenatal care.  She is currently monitored for the following issues for this high-risk pregnancy and has Depression; Generalized anxiety disorder; Ulcerative colitis (HCC); Mass of left side of neck; AMA (advanced maternal age) multigravida 35+; Supervision of other high risk pregnancies, first trimester; and Low lying placenta with hemorrhage, antepartum on her problem list.  Patient reports no complaints.  Contractions: Not present. Vag. Bleeding: None.  Movement: Present. Denies leaking of fluid.   The following portions of the patient's history were reviewed and updated as appropriate: allergies, current medications, past family history, past medical history, past social history, past surgical history and problem list. Problem list updated.  Objective:   Filed Vitals:   08/22/15 0857  BP: 97/63  Pulse: 82  Weight: 167 lb (75.751 kg)    Fetal Status: Fetal Heart Rate (bpm): 143   Movement: Present     General:  Alert, oriented and cooperative. Patient is in no acute distress.  Skin: Skin is warm and dry. No rash noted.   Cardiovascular: Normal heart rate noted  Respiratory: Normal respiratory effort, no problems with respiration noted  Abdomen: Soft, gravid, appropriate for gestational age. Pain/Pressure: Absent     Pelvic: Vag. Bleeding: None Vag D/C Character: Thin   Cervical exam deferred        Extremities: Normal range of motion.  Edema: Trace  Mental Status: Normal mood and affect. Normal behavior. Normal judgment and thought content.   Urinalysis: Urine Protein: Negative Urine Glucose: Negative  Assessment and Plan:  Pregnancy: G2P1001 at 6169w2d  1. Supervision of normal pregnancy in second trimester -pt engaged with baby scripts.  Taking BP and weight as directed - US MFM OB FOLLOW UP; Future - Alpha fetoprotein, maternal  2. Low lying placenta nos or without hemorrhage, second  trimester -vag rest until rpt US - US MFM OB FOLLOW UP; Future  Preterm labor symptoms and general obstetric precautions including but not limited to vaginal bleeding, contractions, leaking of fluid and fetal movement were reviewed in detail with the patient. Please refer to After Visit Summary for other counseling recommendations.  Return in about 8 weeks (around 10/17/2015).   Lesly DukesKelly H Nasya Vincent, MD

## 2015-08-22 NOTE — Progress Notes (Signed)
Pt aware of low lying placenta

## 2015-08-23 ENCOUNTER — Telehealth: Payer: Self-pay | Admitting: *Deleted

## 2015-08-23 LAB — ALPHA FETOPROTEIN, MATERNAL
AFP: 92.6 ng/mL
Curr Gest Age: 20.2 wks.days
MoM for AFP: 1.63
Open Spina bifida: NEGATIVE
Osb Risk: 1:1920 {titer}

## 2015-08-23 NOTE — Telephone Encounter (Signed)
LM on voicemail of neg AFP

## 2015-09-05 MED FILL — LIALDA 1.2 GM TABLET SA: 1.2 | 60 days supply | Qty: 120 | Fill #2

## 2015-10-01 ENCOUNTER — Ambulatory Visit (HOSPITAL_COMMUNITY)
Admission: RE | Admit: 2015-10-01 | Discharge: 2015-10-01 | Disposition: A | Discharge status: Home / Self Care | Payer: 59 | Source: Ambulatory Visit | Attending: Obstetrics & Gynecology | Admitting: Obstetrics & Gynecology

## 2015-10-01 DIAGNOSIS — O09522 Supervision of elderly multigravida, second trimester: Secondary | ICD-10-CM | POA: Insufficient documentation

## 2015-10-01 DIAGNOSIS — O4442 Low lying placenta NOS or without hemorrhage, second trimester: Secondary | ICD-10-CM | POA: Insufficient documentation

## 2015-10-01 DIAGNOSIS — O09891 Supervision of other high risk pregnancies, first trimester: Secondary | ICD-10-CM

## 2015-10-01 DIAGNOSIS — Z36 Encounter for antenatal screening of mother: Secondary | ICD-10-CM | POA: Diagnosis not present

## 2015-10-01 DIAGNOSIS — Z3A26 26 weeks gestation of pregnancy: Secondary | ICD-10-CM | POA: Diagnosis not present

## 2015-10-01 DIAGNOSIS — Z3492 Encounter for supervision of normal pregnancy, unspecified, second trimester: Secondary | ICD-10-CM

## 2015-10-15 ENCOUNTER — Ambulatory Visit (INDEPENDENT_AMBULATORY_CARE_PROVIDER_SITE_OTHER): Payer: 59 | Admitting: Advanced Practice Midwife

## 2015-10-15 ENCOUNTER — Encounter: Payer: Self-pay | Admitting: Advanced Practice Midwife

## 2015-10-15 VITALS — BP 106/70 | HR 87 | Wt 176.0 lb

## 2015-10-15 DIAGNOSIS — Z3492 Encounter for supervision of normal pregnancy, unspecified, second trimester: Secondary | ICD-10-CM

## 2015-10-15 DIAGNOSIS — Z36 Encounter for antenatal screening of mother: Secondary | ICD-10-CM

## 2015-10-15 DIAGNOSIS — Z3480 Encounter for supervision of other normal pregnancy, unspecified trimester: Secondary | ICD-10-CM | POA: Diagnosis not present

## 2015-10-15 DIAGNOSIS — F329 Major depressive disorder, single episode, unspecified: Secondary | ICD-10-CM

## 2015-10-15 DIAGNOSIS — Z3482 Encounter for supervision of other normal pregnancy, second trimester: Secondary | ICD-10-CM

## 2015-10-15 DIAGNOSIS — Z23 Encounter for immunization: Secondary | ICD-10-CM | POA: Diagnosis not present

## 2015-10-15 DIAGNOSIS — F32A Depression, unspecified: Secondary | ICD-10-CM

## 2015-10-15 LAB — CBC
HEMATOCRIT: 39.4 % (ref 36.0–46.0)
HEMOGLOBIN: 13.6 g/dL (ref 12.0–15.0)
MCH: 32.7 pg (ref 26.0–34.0)
MCHC: 34.5 g/dL (ref 30.0–36.0)
MCV: 94.7 fL (ref 78.0–100.0)
MPV: 9.6 fL (ref 8.6–12.4)
Platelets: 233 10*3/uL (ref 150–400)
RBC: 4.16 MIL/uL (ref 3.87–5.11)
RDW: 14.2 % (ref 11.5–15.5)
WBC: 8.8 10*3/uL (ref 4.0–10.5)

## 2015-10-15 NOTE — Progress Notes (Signed)
28 wk labs today 

## 2015-10-15 NOTE — Progress Notes (Signed)
Subjective:  Adriana Frazier is a 35 y.o. G2P1001 at [redacted]w[redacted]d being seen today for ongoing prenatal care.  She is currently monitored for the following issues for this low-risk pregnancy and has Depression; Generalized anxiety disorder; Ulcerative colitis (HCC); Mass of left side of neck; AMA (advanced maternal age) multigravida 35+; and Supervision of other high risk pregnancies, first trimester on her problem list.  Patient reports neck spasm, irritability, feeling of dehydration.  Contractions: Not present. Vag. Bleeding: None.  Movement: Present. Denies leaking of fluid.   The following portions of the patient's history were reviewed and updated as appropriate: allergies, current medications, past family history, past medical history, past social history, past surgical history and problem list. Problem list updated.  Objective:   Filed Vitals:   10/15/15 0833  BP: 106/70  Pulse: 87  Weight: 176 lb (79.833 kg)    Fetal Status: Fetal Heart Rate (bpm): 143   Movement: Present     General:  Alert, oriented and cooperative. Patient is in no acute distress.  Skin: Skin is warm and dry. No rash noted.   Cardiovascular: Normal heart rate noted  Respiratory: Normal respiratory effort, no problems with respiration noted  Abdomen: Soft, gravid, appropriate for gestational age. Pain/Pressure: Absent     Pelvic: Vag. Bleeding: None Vag D/C Character: Thin   Cervical exam deferred        Extremities: Normal range of motion.  Edema: Trace  Mental Status: Normal mood and affect. Normal behavior. Normal judgment and thought content.   Urinalysis: Urine Protein: Negative Urine Glucose: Negative  Assessment and Plan:  Pregnancy: G2P1001 at [redacted]w[redacted]d  1. Supervision of normal pregnancy in second trimester      Discussed hydration, drinking to dilute urine color      Discussed sleep hygeine      Glucola today - RPR - HIV antibody - Glucose Tolerance, 1 HR (50g) - CBC - Tdap vaccine greater than or  equal to 7yo IM  2. Depression      Stable on meds, discussed improved sleep habits  Preterm labor symptoms and general obstetric precautions including but not limited to vaginal bleeding, contractions, leaking of fluid and fetal movement were reviewed in detail with the patient. Please refer to After Visit Summary for other counseling recommendations.   RTO 2 weeks  Aviva Signs, CNM

## 2015-10-15 NOTE — Patient Instructions (Signed)
Third Trimester of Pregnancy The third trimester is from week 29 through week 42, months 7 through 9. The third trimester is a time when the fetus is growing rapidly. At the end of the ninth month, the fetus is about 20 inches in length and weighs 6-10 pounds.  BODY CHANGES Your body goes through many changes during pregnancy. The changes vary from woman to woman.   Your weight will continue to increase. You can expect to gain 25-35 pounds (11-16 kg) by the end of the pregnancy.  You may begin to get stretch marks on your hips, abdomen, and breasts.  You may urinate more often because the fetus is moving lower into your pelvis and pressing on your bladder.  You may develop or continue to have heartburn as a result of your pregnancy.  You may develop constipation because certain hormones are causing the muscles that push waste through your intestines to slow down.  You may develop hemorrhoids or swollen, bulging veins (varicose veins).  You may have pelvic pain because of the weight gain and pregnancy hormones relaxing your joints between the bones in your pelvis. Backaches may result from overexertion of the muscles supporting your posture.  You may have changes in your hair. These can include thickening of your hair, rapid growth, and changes in texture. Some women also have hair loss during or after pregnancy, or hair that feels dry or thin. Your hair will most likely return to normal after your baby is born.  Your breasts will continue to grow and be tender. A yellow discharge may leak from your breasts called colostrum.  Your belly button may stick out.  You may feel short of breath because of your expanding uterus.  You may notice the fetus "dropping," or moving lower in your abdomen.  You may have a bloody mucus discharge. This usually occurs a few days to a week before labor begins.  Your cervix becomes thin and soft (effaced) near your due date. WHAT TO EXPECT AT YOUR PRENATAL  EXAMS  You will have prenatal exams every 2 weeks until week 36. Then, you will have weekly prenatal exams. During a routine prenatal visit:  You will be weighed to make sure you and the fetus are growing normally.  Your blood pressure is taken.  Your abdomen will be measured to track your baby's growth.  The fetal heartbeat will be listened to.  Any test results from the previous visit will be discussed.  You may have a cervical check near your due date to see if you have effaced. At around 36 weeks, your caregiver will check your cervix. At the same time, your caregiver will also perform a test on the secretions of the vaginal tissue. This test is to determine if a type of bacteria, Group B streptococcus, is present. Your caregiver will explain this further. Your caregiver may ask you:  What your birth plan is.  How you are feeling.  If you are feeling the baby move.  If you have had any abnormal symptoms, such as leaking fluid, bleeding, severe headaches, or abdominal cramping.  If you are using any tobacco products, including cigarettes, chewing tobacco, and electronic cigarettes.  If you have any questions. Other tests or screenings that may be performed during your third trimester include:  Blood tests that check for low iron levels (anemia).  Fetal testing to check the health, activity level, and growth of the fetus. Testing is done if you have certain medical conditions or if   there are problems during the pregnancy.  HIV (human immunodeficiency virus) testing. If you are at high risk, you may be screened for HIV during your third trimester of pregnancy. FALSE LABOR You may feel small, irregular contractions that eventually go away. These are called Braxton Hicks contractions, or false labor. Contractions may last for hours, days, or even weeks before true labor sets in. If contractions come at regular intervals, intensify, or become painful, it is best to be seen by your  caregiver.  SIGNS OF LABOR   Menstrual-like cramps.  Contractions that are 5 minutes apart or less.  Contractions that start on the top of the uterus and spread down to the lower abdomen and back.  A sense of increased pelvic pressure or back pain.  A watery or bloody mucus discharge that comes from the vagina. If you have any of these signs before the 37th week of pregnancy, call your caregiver right away. You need to go to the hospital to get checked immediately. HOME CARE INSTRUCTIONS   Avoid all smoking, herbs, alcohol, and unprescribed drugs. These chemicals affect the formation and growth of the baby.  Do not use any tobacco products, including cigarettes, chewing tobacco, and electronic cigarettes. If you need help quitting, ask your health care provider. You may receive counseling support and other resources to help you quit.  Follow your caregiver's instructions regarding medicine use. There are medicines that are either safe or unsafe to take during pregnancy.  Exercise only as directed by your caregiver. Experiencing uterine cramps is a good sign to stop exercising.  Continue to eat regular, healthy meals.  Wear a good support bra for breast tenderness.  Do not use hot tubs, steam rooms, or saunas.  Wear your seat belt at all times when driving.  Avoid raw meat, uncooked cheese, cat litter boxes, and soil used by cats. These carry germs that can cause birth defects in the baby.  Take your prenatal vitamins.  Take 1500-2000 mg of calcium daily starting at the 20th week of pregnancy until you deliver your baby.  Try taking a stool softener (if your caregiver approves) if you develop constipation. Eat more high-fiber foods, such as fresh vegetables or fruit and whole grains. Drink plenty of fluids to keep your urine clear or pale yellow.  Take warm sitz baths to soothe any pain or discomfort caused by hemorrhoids. Use hemorrhoid cream if your caregiver approves.  If  you develop varicose veins, wear support hose. Elevate your feet for 15 minutes, 3-4 times a day. Limit salt in your diet.  Avoid heavy lifting, wear low heal shoes, and practice good posture.  Rest a lot with your legs elevated if you have leg cramps or low back pain.  Visit your dentist if you have not gone during your pregnancy. Use a soft toothbrush to brush your teeth and be gentle when you floss.  A sexual relationship may be continued unless your caregiver directs you otherwise.  Do not travel far distances unless it is absolutely necessary and only with the approval of your caregiver.  Take prenatal classes to understand, practice, and ask questions about the labor and delivery.  Make a trial run to the hospital.  Pack your hospital bag.  Prepare the baby's nursery.  Continue to go to all your prenatal visits as directed by your caregiver. SEEK MEDICAL CARE IF:  You are unsure if you are in labor or if your water has broken.  You have dizziness.  You have  mild pelvic cramps, pelvic pressure, or nagging pain in your abdominal area.  You have persistent nausea, vomiting, or diarrhea.  You have a bad smelling vaginal discharge.  You have pain with urination. SEEK IMMEDIATE MEDICAL CARE IF:   You have a fever.  You are leaking fluid from your vagina.  You have spotting or bleeding from your vagina.  You have severe abdominal cramping or pain.  You have rapid weight loss or gain.  You have shortness of breath with chest pain.  You notice sudden or extreme swelling of your face, hands, ankles, feet, or legs.  You have not felt your baby move in over an hour.  You have severe headaches that do not go away with medicine.  You have vision changes.   This information is not intended to replace advice given to you by your health care provider. Make sure you discuss any questions you have with your health care provider.   Document Released: 07/29/2001 Document  Revised: 08/25/2014 Document Reviewed: 10/05/2012 Elsevier Interactive Patient Education 2016 [employer] Diphenhydramine capsules or tablets What is this medicine? DIPHENHYDRAMINE (dye fen HYE dra meen) is an antihistamine. It is used to treat the symptoms of an allergic reaction. It is also used to treat Parkinson's disease. This medicine is also used to prevent and to treat motion sickness and as a nighttime sleep aid. This medicine may be used for other purposes; ask your health care provider or pharmacist if you have questions. What should I tell my health care provider before I take this medicine? They need to know if you have any of these conditions: -asthma or lung disease -glaucoma -high blood pressure or heart disease -liver disease -pain or difficulty passing urine -prostate trouble -ulcers or other stomach problems -an unusual or allergic reaction to diphenhydramine, other medicines foods, dyes, or preservatives such as sulfites -pregnant or trying to get pregnant -breast-feeding How should I use this medicine? Take this medicine by mouth with a full glass of water. Follow the directions on the prescription label. Take your doses at regular intervals. Do not take your medicine more often than directed. To prevent motion sickness start taking this medicine 30 to 60 minutes before you leave. Talk to your pediatrician regarding the use of this medicine in children. Special care may be needed. Patients over 46 years old may have a stronger reaction and need a smaller dose. Overdosage: If you think you have taken too much of this medicine contact a poison control center or emergency room at once. NOTE: This medicine is only for you. Do not share this medicine with others. What if I miss a dose? If you miss a dose, take it as soon as you can. If it is almost time for your next dose, take only that dose. Do not take double or extra doses. What may interact with this medicine? Do  not take this medicine with any of the following medications: -MAOIs like Carbex, Eldepryl, Marplan, Nardil, and Parnate This medicine may also interact with the following medications: -alcohol -barbiturates, like phenobarbital -medicines for bladder spasm like oxybutynin, tolterodine -medicines for blood pressure -medicines for depression, anxiety, or psychotic disturbances -medicines for movement abnormalities or Parkinson's disease -medicines for sleep -other medicines for cold, cough or allergy -some medicines for the stomach like chlordiazepoxide, dicyclomine This list may not describe all possible interactions. Give your health care provider a list of all the medicines, herbs, non-prescription drugs, or dietary supplements you use. Also tell them if you  smoke, drink alcohol, or use illegal drugs. Some items may interact with your medicine. What should I watch for while using this medicine? Visit your doctor or health care professional for regular check ups. Tell your doctor if your symptoms do not improve or if they get worse. Your mouth may get dry. Chewing sugarless gum or sucking hard candy, and drinking plenty of water may help. Contact your doctor if the problem does not go away or is severe. This medicine may cause dry eyes and blurred vision. If you wear contact lenses you may feel some discomfort. Lubricating drops may help. See your eye doctor if the problem does not go away or is severe. You may get drowsy or dizzy. Do not drive, use machinery, or do anything that needs mental alertness until you know how this medicine affects you. Do not stand or sit up quickly, especially if you are an older patient. This reduces the risk of dizzy or fainting spells. Alcohol may interfere with the effect of this medicine. Avoid alcoholic drinks. What side effects may I notice from receiving this medicine? Side effects that you should report to your doctor or health care professional as soon as  possible: -allergic reactions like skin rash, itching or hives, swelling of the face, lips, or tongue -changes in vision -confused, agitated, nervous -irregular or fast heartbeat -tremor -trouble passing urine -unusual bleeding or bruising -unusually weak or tired Side effects that usually do not require medical attention (report to your doctor or health care professional if they continue or are bothersome): -constipation, diarrhea -drowsy -headache -loss of appetite -stomach upset, vomiting -thick mucous This list may not describe all possible side effects. Call your doctor for medical advice about side effects. You may report side effects to FDA at 1-800-FDA-1088. Where should I keep my medicine? Keep out of the reach of children. Store at room temperature between 15 and 30 degrees C (59 and 86 degrees F). Keep container closed tightly. Throw away any unused medicine after the expiration date. NOTE: This sheet is a summary. It may not cover all possible information. If you have questions about this medicine, talk to your doctor, pharmacist, or health care provider.    2016, Elsevier/Gold Standard. (2007-11-22 17:06:22)

## 2015-10-16 LAB — GLUCOSE TOLERANCE, 1 HOUR (50G) W/O FASTING: Glucose, 1 Hr, gestational: 93 mg/dL (ref <140)

## 2015-10-16 LAB — HIV ANTIBODY (ROUTINE TESTING W REFLEX): HIV 1&2 Ab, 4th Generation: NONREACTIVE

## 2015-10-18 ENCOUNTER — Telehealth: Payer: Self-pay | Admitting: *Deleted

## 2015-10-18 DIAGNOSIS — O09891 Supervision of other high risk pregnancies, first trimester: Secondary | ICD-10-CM

## 2015-10-18 LAB — RPR

## 2015-10-18 NOTE — Telephone Encounter (Signed)
LM on voicemail of normal 1 hr GTT. 

## 2015-10-23 ENCOUNTER — Encounter: Payer: Self-pay | Admitting: *Deleted

## 2015-11-01 MED FILL — LIALDA 1.2 GM TABLET SA: 1.2 | 60 days supply | Qty: 120 | Fill #3

## 2015-11-01 MED FILL — BUPROPION HCL SR 150 MG TAB: 150 | 90 days supply | Qty: 90 | Fill #1

## 2015-11-01 MED FILL — FLUoxetine HCL 40 MG CAPS: 40 | 90 days supply | Qty: 90 | Fill #1

## 2015-11-12 ENCOUNTER — Ambulatory Visit (INDEPENDENT_AMBULATORY_CARE_PROVIDER_SITE_OTHER): Payer: 59 | Admitting: Advanced Practice Midwife

## 2015-11-12 VITALS — BP 97/78 | HR 108 | Wt 181.0 lb

## 2015-11-12 DIAGNOSIS — O358XX1 Maternal care for other (suspected) fetal abnormality and damage, fetus 1: Secondary | ICD-10-CM

## 2015-11-12 DIAGNOSIS — O35EXX1 Maternal care for other (suspected) fetal abnormality and damage, fetal genitourinary anomalies, fetus 1: Secondary | ICD-10-CM

## 2015-11-12 DIAGNOSIS — Z36 Encounter for antenatal screening of mother: Secondary | ICD-10-CM

## 2015-11-12 DIAGNOSIS — Z1389 Encounter for screening for other disorder: Secondary | ICD-10-CM

## 2015-11-12 DIAGNOSIS — O09891 Supervision of other high risk pregnancies, first trimester: Secondary | ICD-10-CM

## 2015-11-12 NOTE — Progress Notes (Signed)
Subjective:  Dominic PeaSarah Brosch is a 35 y.o. G2P1001 at 4148w0d being seen today for ongoing prenatal care.  She is currently monitored for the following issues for this high-risk pregnancy and has Depression; Generalized anxiety disorder; Ulcerative colitis (HCC); Mass of left side of neck; AMA (advanced maternal age) multigravida 35+; and Supervision of other high risk pregnancies, first trimester on her problem list.  Patient reports no complaints.  Contractions: Not present. Vag. Bleeding: None.  Movement: Absent. Denies leaking of fluid.   The following portions of the patient's history were reviewed and updated as appropriate: allergies, current medications, past family history, past medical history, past social history, past surgical history and problem list. Problem list updated.  Objective:   Filed Vitals:   11/12/15 0837  BP: 97/78  Pulse: 108  Weight: 181 lb (82.101 kg)    Fetal Status: Fetal Heart Rate (bpm): 142   Movement: Absent     General:  Alert, oriented and cooperative. Patient is in no acute distress.  Skin: Skin is warm and dry. No rash noted.   Cardiovascular: Normal heart rate noted  Respiratory: Normal respiratory effort, no problems with respiration noted  Abdomen: Soft, gravid, appropriate for gestational age. Pain/Pressure: Absent     Pelvic: Vag. Bleeding: None Vag D/C Character: Thin   Cervical exam deferred        Extremities: Normal range of motion.  Edema: None  Mental Status: Normal mood and affect. Normal behavior. Normal judgment and thought content.   Urinalysis: Urine Protein: Negative Urine Glucose: Negative  Assessment and Plan:  Pregnancy: G2P1001 at 4448w0d  1. Ultrasound recheck of fetal pyelectasis, antepartum, fetus 1  - US MFM OB FOLLOW UP; Future  2. Encounter for routine screening for malformation using ultrasonics  - US MFM OB FOLLOW UP; Future  Preterm labor symptoms and general obstetric precautions including but not limited to  vaginal bleeding, contractions, leaking of fluid and fetal movement were reviewed in detail with the patient. Please refer to After Visit Summary for other counseling recommendations.  Return in about 4 weeks (around 12/10/2015).   Dorathy KinsmanVirginia Jenise Iannelli, CNM

## 2015-11-12 NOTE — Patient Instructions (Addendum)
Braxton Hicks Contractions Contractions of the uterus can occur throughout pregnancy. Contractions are not always a sign that you are in labor.  WHAT ARE BRAXTON HICKS CONTRACTIONS?  Contractions that occur before labor are called Braxton Hicks contractions, or false labor. Toward the end of pregnancy (32-34 weeks), these contractions can develop more often and may become more forceful. This is not true labor because these contractions do not result in opening (dilatation) and thinning of the cervix. They are sometimes difficult to tell apart from true labor because these contractions can be forceful and people have different pain tolerances. You should not feel embarrassed if you go to the hospital with false labor. Sometimes, the only way to tell if you are in true labor is for your health care provider to look for changes in the cervix. If there are no prenatal problems or other health problems associated with the pregnancy, it is completely safe to be sent home with false labor and await the onset of true labor. HOW CAN YOU TELL THE DIFFERENCE BETWEEN TRUE AND FALSE LABOR? False Labor  The contractions of false labor are usually shorter and not as hard as those of true labor.   The contractions are usually irregular.   The contractions are often felt in the front of the lower abdomen and in the groin.   The contractions may go away when you walk around or change positions while lying down.   The contractions get weaker and are shorter lasting as time goes on.   The contractions do not usually become progressively stronger, regular, and closer together as with true labor.  True Labor  Contractions in true labor last 30-70 seconds, become very regular, usually become more intense, and increase in frequency.   The contractions do not go away with walking.   The discomfort is usually felt in the top of the uterus and spreads to the lower abdomen and low back.   True labor can be  determined by your health care provider with an exam. This will show that the cervix is dilating and getting thinner.  WHAT TO REMEMBER  Keep up with your usual exercises and follow other instructions given by your health care provider.   Take medicines as directed by your health care provider.   Keep your regular prenatal appointments.   Eat and drink lightly if you think you are going into labor.   If Braxton Hicks contractions are making you uncomfortable:   Change your position from lying down or resting to walking, or from walking to resting.   Sit and rest in a tub of warm water.   Drink 2-3 glasses of water. Dehydration may cause these contractions.   Do slow and deep breathing several times an hour.  WHEN SHOULD I SEEK IMMEDIATE MEDICAL CARE? Seek immediate medical care if:  Your contractions become stronger, more regular, and closer together.   You have fluid leaking or gushing from your vagina.   You have a fever.   You pass blood-tinged mucus.   You have vaginal bleeding.   You have continuous abdominal pain.   You have low back pain that you never had before.   You feel your baby's head pushing down and causing pelvic pressure.   Your baby is not moving as much as it used to.    This information is not intended to replace advice given to you by your health care provider. Make sure you discuss any questions you have with your health care  provider.   Document Released: 08/04/2005 Document Revised: 08/09/2013 Document Reviewed: 05/16/2013 Elsevier Interactive Patient Education [employer] Inc[employer]  Contraception Choices Contraception (birth control) is the use of any methods or devices to prevent pregnancy. Below are some methods to help avoid pregnancy. HORMONAL METHODS   Contraceptive implant. This is a thin, plastic tube containing progesterone hormone. It does not contain estrogen hormone. Your health care provider inserts the tube in  the inner part of the upper arm. The tube can remain in place for up to 3 years. After 3 years, the implant must be removed. The implant prevents the ovaries from releasing an egg (ovulation), thickens the cervical mucus to prevent sperm from entering the uterus, and thins the lining of the inside of the uterus.  Progesterone-only injections. These injections are given every 3 months by your health care provider to prevent pregnancy. This synthetic progesterone hormone stops the ovaries from releasing eggs. It also thickens cervical mucus and changes the uterine lining. This makes it harder for sperm to survive in the uterus.  Birth control pills. These pills contain estrogen and progesterone hormone. They work by preventing the ovaries from releasing eggs (ovulation). They also cause the cervical mucus to thicken, preventing the sperm from entering the uterus. Birth control pills are prescribed by a health care provider.Birth control pills can also be used to treat heavy periods.  Minipill. This type of birth control pill contains only the progesterone hormone. They are taken every day of each month and must be prescribed by your health care provider.  Birth control patch. The patch contains hormones similar to those in birth control pills. It must be changed once a week and is prescribed by a health care provider.  Vaginal ring. The ring contains hormones similar to those in birth control pills. It is left in the vagina for 3 weeks, removed for 1 week, and then a new one is put back in place. The patient must be comfortable inserting and removing the ring from the vagina.A health care provider's prescription is necessary.  Emergency contraception. Emergency contraceptives prevent pregnancy after unprotected sexual intercourse. This pill can be taken right after sex or up to 5 days after unprotected sex. It is most effective the sooner you take the pills after having sexual intercourse. Most emergency  contraceptive pills are available without a prescription. Check with your pharmacist. Do not use emergency contraception as your only form of birth control. BARRIER METHODS   Female condom. This is a thin sheath (latex or rubber) that is worn over the penis during sexual intercourse. It can be used with spermicide to increase effectiveness.  Female condom. This is a soft, loose-fitting sheath that is put into the vagina before sexual intercourse.  Diaphragm. This is a soft, latex, dome-shaped barrier that must be fitted by a health care provider. It is inserted into the vagina, along with a spermicidal jelly. It is inserted before intercourse. The diaphragm should be left in the vagina for 6 to 8 hours after intercourse.  Cervical cap. This is a round, soft, latex or plastic cup that fits over the cervix and must be fitted by a health care provider. The cap can be left in place for up to 48 hours after intercourse.  Sponge. This is a soft, circular piece of polyurethane foam. The sponge has spermicide in it. It is inserted into the vagina after wetting it and before sexual intercourse.  Spermicides. These are chemicals that kill or block sperm from entering  the cervix and uterus. They come in the form of creams, jellies, suppositories, foam, or tablets. They do not require a prescription. They are inserted into the vagina with an applicator before having sexual intercourse. The process must be repeated every time you have sexual intercourse. INTRAUTERINE CONTRACEPTION  Intrauterine device (IUD). This is a T-shaped device that is put in a woman's uterus during a menstrual period to prevent pregnancy. There are 2 types:  Copper IUD. This type of IUD is wrapped in copper wire and is placed inside the uterus. Copper makes the uterus and fallopian tubes produce a fluid that kills sperm. It can stay in place for 10 years.  Hormone IUD. This type of IUD contains the hormone progestin (synthetic  progesterone). The hormone thickens the cervical mucus and prevents sperm from entering the uterus, and it also thins the uterine lining to prevent implantation of a fertilized egg. The hormone can weaken or kill the sperm that get into the uterus. It can stay in place for 3-5 years, depending on which type of IUD is used. PERMANENT METHODS OF CONTRACEPTION  Female tubal ligation. This is when the woman's fallopian tubes are surgically sealed, tied, or blocked to prevent the egg from traveling to the uterus.  Hysteroscopic sterilization. This involves placing a small coil or insert into each fallopian tube. Your doctor uses a technique called hysteroscopy to do the procedure. The device causes scar tissue to form. This results in permanent blockage of the fallopian tubes, so the sperm cannot fertilize the egg. It takes about 3 months after the procedure for the tubes to become blocked. You must use another form of birth control for these 3 months.  Female sterilization. This is when the female has the tubes that carry sperm tied off (vasectomy).This blocks sperm from entering the vagina during sexual intercourse. After the procedure, the man can still ejaculate fluid (semen). NATURAL PLANNING METHODS  Natural family planning. This is not having sexual intercourse or using a barrier method (condom, diaphragm, cervical cap) on days the woman could become pregnant.  Calendar method. This is keeping track of the length of each menstrual cycle and identifying when you are fertile.  Ovulation method. This is avoiding sexual intercourse during ovulation.  Symptothermal method. This is avoiding sexual intercourse during ovulation, using a thermometer and ovulation symptoms.  Post-ovulation method. This is timing sexual intercourse after you have ovulated. Regardless of which type or method of contraception you choose, it is important that you use condoms to protect against the transmission of sexually  transmitted infections (STIs). Talk with your health care provider about which form of contraception is most appropriate for you.   This information is not intended to replace advice given to you by your health care provider. Make sure you discuss any questions you have with your health care provider.   Document Released: 08/04/2005 Document Revised: 08/09/2013 Document Reviewed: 01/27/2013 Elsevier Interactive Patient Education 2016 Elsevier Inc.  Postpartum Tubal Ligation Postpartum tubal ligation (PPTL) is a procedure that closes the fallopian tubes right after childbirth or 1-2 days after childbirth. PPTL is done before the uterus returns to its normal location. The procedure is also called a mini-laparotomy. When the fallopian tubes are closed, the eggs that are released from the ovaries cannot enter the uterus, and sperm cannot reach the egg. PPTL is done so you will not be able to get pregnant or have a baby. Although this procedure may be undone (reversed), it should be considered permanent and  irreversible. If you want to have future pregnancies, you should not have this procedure. LET [facility] CARE PROVIDER KNOW ABOUT:  Any allergies you have.  All medicines you are taking, including vitamins, herbs, eye drops, creams, and over-the-counter medicines. This includes any use of steroids, either by mouth or in cream form.  Previous problems you or members of your family have had with the use of anesthetics.  Any blood disorders you have.  Previous surgeries you have had.  Any medical conditions you may have. RISKS AND COMPLICATIONS  Infection.  Bleeding.  Injury to surrounding organs.  Side effects from anesthetics.  Failure of the procedure.  Ectopic pregnancy.  Future regret about having the procedure done. BEFORE THE PROCEDURE  You may need to sign certain documents, including an informed consent form, up to 30 days before the date of your tubal  ligation.  Follow instructions from your health care provider about eating and drinking restrictions. PROCEDURE  If done 1-2 days after a vaginal delivery:  You will be given one or more of the following:  A medicine that helps you relax (sedative).  A medicine that numbs the area (local anesthetic).  A medicine that makes you fall asleep (general anesthetic).  A medicine that is injected into an area of your body that numbs everything below the injection site (regional anesthetic).  If you have been given general anesthetic, a tube will be put down your throat to help you breathe.  Your bladder may be emptied with a small tube (catheter).  A small cut (incision) will be made just above the pubic hair line.  The fallopian tubes will be located and brought up through the incision.  The fallopian tubes will be tied off or burned (cauterized), or they will be closed with a clamp, ring, or clip. In many cases, a small portion in the center of each fallopian tube will also be removed.  The incision will be closed with stitches (sutures).  A bandage (dressing) will be placed over the incision. The procedure may vary among health care providers and hospitals. If done after a cesarean delivery:  Tubal ligation will be done through the incision that was used for the cesarean delivery of your baby.  After the tubes are closed, the incision will be closed with stitches (sutures).  A bandage (dressing) will be placed over the incision. The procedure may vary among health care providers and hospitals. AFTER THE PROCEDURE  Your blood pressure, heart rate, breathing rate, and blood oxygen level will be monitored often until the medicines you were given have worn off.  You will be given pain medicine as needed.  If you had general anesthetic, you may have some mild discomfort in your throat. This is from the breathing tube that was placed in your throat while you were sleeping.  You may  feel tired, and you should rest for the remainder of the day.  You may have some pain or cramps in the abdominal area for 3-7 days.   This information is not intended to replace advice given to you by your health care provider. Make sure you discuss any questions you have with your health care provider.   Document Released: 08/04/2005 Document Revised: 12/19/2014 Document Reviewed: 11/15/2011 Elsevier Interactive Patient Education [employer]

## 2015-11-20 ENCOUNTER — Other Ambulatory Visit: Payer: Self-pay | Admitting: Advanced Practice Midwife

## 2015-11-20 ENCOUNTER — Ambulatory Visit (HOSPITAL_COMMUNITY)
Admission: RE | Admit: 2015-11-20 | Discharge: 2015-11-20 | Disposition: A | Discharge status: Home / Self Care | Payer: 59 | Source: Ambulatory Visit | Attending: Advanced Practice Midwife | Admitting: Advanced Practice Midwife

## 2015-11-20 DIAGNOSIS — O09523 Supervision of elderly multigravida, third trimester: Secondary | ICD-10-CM | POA: Insufficient documentation

## 2015-11-20 DIAGNOSIS — Z1389 Encounter for screening for other disorder: Secondary | ICD-10-CM

## 2015-11-20 DIAGNOSIS — O358XX1 Maternal care for other (suspected) fetal abnormality and damage, fetus 1: Secondary | ICD-10-CM

## 2015-11-20 DIAGNOSIS — O359XX0 Maternal care for (suspected) fetal abnormality and damage, unspecified, not applicable or unspecified: Secondary | ICD-10-CM

## 2015-11-20 DIAGNOSIS — O358XX0 Maternal care for other (suspected) fetal abnormality and damage, not applicable or unspecified: Secondary | ICD-10-CM | POA: Insufficient documentation

## 2015-11-20 DIAGNOSIS — O35EXX0 Maternal care for other (suspected) fetal abnormality and damage, fetal genitourinary anomalies, not applicable or unspecified: Secondary | ICD-10-CM

## 2015-11-20 DIAGNOSIS — O35EXX1 Maternal care for other (suspected) fetal abnormality and damage, fetal genitourinary anomalies, fetus 1: Secondary | ICD-10-CM

## 2015-11-20 DIAGNOSIS — O09891 Supervision of other high risk pregnancies, first trimester: Secondary | ICD-10-CM

## 2015-11-20 DIAGNOSIS — Z3A33 33 weeks gestation of pregnancy: Secondary | ICD-10-CM | POA: Diagnosis not present

## 2015-11-25 DIAGNOSIS — O358XX0 Maternal care for other (suspected) fetal abnormality and damage, not applicable or unspecified: Secondary | ICD-10-CM | POA: Insufficient documentation

## 2015-11-25 DIAGNOSIS — O35EXX0 Maternal care for other (suspected) fetal abnormality and damage, fetal genitourinary anomalies, not applicable or unspecified: Secondary | ICD-10-CM | POA: Insufficient documentation

## 2015-12-10 ENCOUNTER — Ambulatory Visit (INDEPENDENT_AMBULATORY_CARE_PROVIDER_SITE_OTHER): Payer: 59 | Admitting: Obstetrics & Gynecology

## 2015-12-10 VITALS — BP 99/67 | HR 83 | Wt 190.0 lb

## 2015-12-10 DIAGNOSIS — Z3493 Encounter for supervision of normal pregnancy, unspecified, third trimester: Secondary | ICD-10-CM

## 2015-12-10 DIAGNOSIS — E559 Vitamin D deficiency, unspecified: Secondary | ICD-10-CM

## 2015-12-10 DIAGNOSIS — O09891 Supervision of other high risk pregnancies, first trimester: Secondary | ICD-10-CM

## 2015-12-10 DIAGNOSIS — Z3480 Encounter for supervision of other normal pregnancy, unspecified trimester: Secondary | ICD-10-CM | POA: Diagnosis not present

## 2015-12-10 DIAGNOSIS — O3663X0 Maternal care for excessive fetal growth, third trimester, not applicable or unspecified: Secondary | ICD-10-CM | POA: Diagnosis not present

## 2015-12-10 DIAGNOSIS — Z113 Encounter for screening for infections with a predominantly sexual mode of transmission: Secondary | ICD-10-CM | POA: Diagnosis not present

## 2015-12-10 DIAGNOSIS — Z36 Encounter for antenatal screening of mother: Secondary | ICD-10-CM | POA: Diagnosis not present

## 2015-12-10 DIAGNOSIS — Z3483 Encounter for supervision of other normal pregnancy, third trimester: Secondary | ICD-10-CM

## 2015-12-10 LAB — GLUCOSE, POCT (MANUAL RESULT ENTRY): POC GLUCOSE: 79 mg/dL (ref 70–99)

## 2015-12-10 NOTE — Progress Notes (Signed)
CBG 79 

## 2015-12-10 NOTE — Patient Instructions (Signed)
Vaginal Delivery °During delivery, your health care provider will help you give birth to your baby. During a vaginal delivery, you will work to push the baby out of your vagina. However, before you can push your baby out, a few things need to happen. The opening of your uterus (cervix) has to soften, thin out, and open up (dilate) all the way to 10 cm. Also, your baby has to move down from the uterus into your vagina.  °SIGNS OF LABOR  °Your health care provider will first need to make sure you are in labor. Signs of labor include:  °· Passing what is called the mucous plug before labor begins. This is a small amount of blood-stained mucus. °· Having regular, painful uterine contractions.   °· The time between contractions gets shorter.   °· The discomfort and pain gradually get more intense. °· Contraction pains get worse when walking and do not go away when resting.   °· Your cervix becomes thinner (effacement) and dilates. °BEFORE THE DELIVERY °Once you are in labor and admitted into the hospital or care center, your health care provider may do the following:  °· Perform a complete physical exam. °· Review any complications related to pregnancy or labor.  °· Check your blood pressure, pulse, temperature, and heart rate (vital signs).   °· Determine if, and when, the rupture of amniotic membranes occurred. °· Do a vaginal exam (using a sterile glove and lubricant) to determine:   °¨ The position (presentation) of the baby. Is the baby's head presenting first (vertex) in the birth canal (vagina), or are the feet or buttocks first (breech)?   °¨ The level (station) of the baby's head within the birth canal.   °¨ The effacement and dilatation of the cervix.   °· An electronic fetal monitor is usually placed on your abdomen when you first arrive. This is used to monitor your contractions and the baby's heart rate. °¨ When the monitor is on your abdomen (external fetal monitor), it can only pick up the frequency and  length of your contractions. It cannot tell the strength of your contractions. °¨ If it becomes necessary for your health care provider to know exactly how strong your contractions are or to see exactly what the baby's heart rate is doing, an internal monitor may be inserted into your vagina and uterus. Your health care provider will discuss the benefits and risks of using an internal monitor and obtain your permission before inserting the device. °¨ Continuous fetal monitoring may be needed if you have an epidural, are receiving certain medicines (such as oxytocin), or have pregnancy or labor complications. °· An IV access tube may be placed into a vein in your arm to deliver fluids and medicines if necessary. °THREE STAGES OF LABOR AND DELIVERY °Normal labor and delivery is divided into three stages. °First Stage °This stage starts when you begin to contract regularly and your cervix begins to efface and dilate. It ends when your cervix is completely open (fully dilated). The first stage is the longest stage of labor and can last from 3 hours to 15 hours.  °Several methods are available to help with labor pain. You and your health care provider will decide which option is best for you. Options include:  °· Opioid medicines. These are strong pain medicines that you can get through your IV tube or as a shot into your muscle. These medicines lessen pain but do not make it go away completely.  °· Epidural. A medicine is given through a thin tube that   is inserted in your back. The medicine numbs the lower part of your body and prevents any pain in that area. °· Paracervical pain medicine. This is an injection of an anesthetic on each side of your cervix.   °· You may request natural childbirth, which does not involve the use of pain medicines or an epidural during labor and delivery. Instead, you will use other things, such as breathing exercises, to help cope with the pain. °Second Stage °The second stage of labor  begins when your cervix is fully dilated at 10 cm. It continues until you push your baby down through the birth canal and the baby is born. This stage can take only minutes or several hours. °· The location of your baby's head as it moves through the birth canal is reported as a number called a station. If the baby's head has not started its descent, the station is described as being at minus 3 (-3). When your baby's head is at the zero station, it is at the middle of the birth canal and is engaged in the pelvis. The station of your baby helps indicate the progress of the second stage of labor. °· When your baby is born, your health care provider may hold the baby with his or her head lowered to prevent amniotic fluid, mucus, and blood from getting into the baby's lungs. The baby's mouth and nose may be suctioned with a small bulb syringe to remove any additional fluid. °· Your health care provider may then place the baby on your stomach. It is important to keep the baby from getting cold. To do this, the health care provider will dry the baby off, place the baby directly on your skin (with no blankets between you and the baby), and cover the baby with warm, dry blankets.   °· The umbilical cord is cut. °Third Stage °During the third stage of labor, your health care provider will deliver the placenta (afterbirth) and make sure your bleeding is under control. The delivery of the placenta usually takes about 5 minutes but can take up to 30 minutes. After the placenta is delivered, a medicine may be given either by IV or injection to help contract the uterus and control bleeding. If you are planning to breastfeed, you can try to do so now. °After you deliver the placenta, your uterus should contract and get very firm. If your uterus does not remain firm, your health care provider will massage it. This is important because the contraction of the uterus helps cut off bleeding at the site where the placenta was attached  to your uterus. If your uterus does not contract properly and stay firm, you may continue to bleed heavily. If there is a lot of bleeding, medicines may be given to contract the uterus and stop the bleeding.  °  °This information is not intended to replace advice given to you by your health care provider. Make sure you discuss any questions you have with your health care provider. °  °Document Released: 05/13/2008 Document Revised: 08/25/2014 Document Reviewed: 03/31/2012 °Elsevier Interactive Patient Education ©2016 Elsevier Inc. ° °

## 2015-12-10 NOTE — Progress Notes (Signed)
Subjective:  Adriana Frazier is a 35 y.o. G2P1001 at 2081w0d being seen today for ongoing prenatal care.  She is currently monitored for the following issues for this high-risk pregnancy and has Depression; Generalized anxiety disorder; Ulcerative colitis (HCC); Mass of left side of neck; AMA (advanced maternal age) multigravida 35+; Supervision of other high risk pregnancies, first trimester; Ultrasound recheck of fetal pyelectasis, antepartum; and Excess or deficiency of vitamin D on her problem list.  Patient reports no complaints.  Contractions: Not present. Vag. Bleeding: None.  Movement: Present. Denies leaking of fluid.   The following portions of the patient's history were reviewed and updated as appropriate: allergies, current medications, past family history, past medical history, past social history, past surgical history and problem list. Problem list updated.  Objective:   Filed Vitals:   12/10/15 0847  BP: 99/67  Pulse: 83  Weight: 190 lb (86.183 kg)    Fetal Status: Fetal Heart Rate (bpm): 141   Movement: Present     General:  Alert, oriented and cooperative. Patient is in no acute distress.  Skin: Skin is warm and dry. No rash noted.   Cardiovascular: Normal heart rate noted  Respiratory: Normal respiratory effort, no problems with respiration noted  Abdomen: Soft, gravid, appropriate for gestational age. Pain/Pressure: Absent     Pelvic: Vag. Bleeding: None Vag D/C Character: Mucous   Cervical exam performed        Extremities: Normal range of motion.  Edema: Trace  Mental Status: Normal mood and affect. Normal behavior. Normal judgment and thought content.   Urinalysis: Urine Protein: Negative Urine Glucose: Negative  Assessment and Plan:  Pregnancy: G2P1001 at 1881w0d  1. Normal pregnancy in third trimester - Culture, beta strep (group b only) - Urine cytology ancillary only  2. Macrosomia -Will discuss growth US at next visit. - POCT Glucose (CBG)--79 postprandial  after oatmeal.  No signs of GDM  Term labor symptoms and general obstetric precautions including but not limited to vaginal bleeding, contractions, leaking of fluid and fetal movement were reviewed in detail with the patient. Please refer to After Visit Summary for other counseling recommendations.  Return in about 2 weeks (around 12/24/2015).  [employer] Compliant  Lesly DukesKelly H Brighton Pilley, MD

## 2015-12-11 ENCOUNTER — Telehealth: Payer: Self-pay | Admitting: *Deleted

## 2015-12-11 LAB — URINE CYTOLOGY ANCILLARY ONLY
Chlamydia: NEGATIVE
Neisseria Gonorrhea: NEGATIVE

## 2015-12-11 LAB — VITAMIN D 25 HYDROXY (VIT D DEFICIENCY, FRACTURES): Vit D, 25-Hydroxy: 47 ng/mL (ref 30–100)

## 2015-12-11 NOTE — Telephone Encounter (Signed)
Pt notified of normal Vitamin D level. 

## 2015-12-12 ENCOUNTER — Encounter (HOSPITAL_COMMUNITY): Payer: Self-pay | Admitting: *Deleted

## 2015-12-12 ENCOUNTER — Inpatient Hospital Stay (HOSPITAL_COMMUNITY)
Admission: AD | Admit: 2015-12-12 | Discharge: 2015-12-14 | DRG: 775 | Disposition: A | Discharge status: Home / Self Care | Payer: 59 | Source: Ambulatory Visit | Attending: Family Medicine | Admitting: Family Medicine

## 2015-12-12 DIAGNOSIS — O99344 Other mental disorders complicating childbirth: Secondary | ICD-10-CM | POA: Diagnosis present

## 2015-12-12 DIAGNOSIS — K5190 Ulcerative colitis, unspecified, without complications: Secondary | ICD-10-CM | POA: Diagnosis present

## 2015-12-12 DIAGNOSIS — O09891 Supervision of other high risk pregnancies, first trimester: Secondary | ICD-10-CM

## 2015-12-12 DIAGNOSIS — O42019 Preterm premature rupture of membranes, onset of labor within 24 hours of rupture, unspecified trimester: Secondary | ICD-10-CM

## 2015-12-12 DIAGNOSIS — F329 Major depressive disorder, single episode, unspecified: Secondary | ICD-10-CM | POA: Diagnosis present

## 2015-12-12 DIAGNOSIS — Z3A36 36 weeks gestation of pregnancy: Secondary | ICD-10-CM

## 2015-12-12 DIAGNOSIS — O09529 Supervision of elderly multigravida, unspecified trimester: Secondary | ICD-10-CM

## 2015-12-12 DIAGNOSIS — O9962 Diseases of the digestive system complicating childbirth: Secondary | ICD-10-CM | POA: Diagnosis present

## 2015-12-12 DIAGNOSIS — F411 Generalized anxiety disorder: Secondary | ICD-10-CM | POA: Diagnosis present

## 2015-12-12 DIAGNOSIS — F418 Other specified anxiety disorders: Secondary | ICD-10-CM | POA: Diagnosis present

## 2015-12-12 DIAGNOSIS — O42013 Preterm premature rupture of membranes, onset of labor within 24 hours of rupture, third trimester: Secondary | ICD-10-CM | POA: Diagnosis not present

## 2015-12-12 DIAGNOSIS — F32A Depression, unspecified: Secondary | ICD-10-CM | POA: Diagnosis present

## 2015-12-12 HISTORY — DX: Major depressive disorder, single episode, unspecified: F32.9

## 2015-12-12 HISTORY — DX: Anxiety disorder, unspecified: F41.9

## 2015-12-12 HISTORY — DX: Depression, unspecified: F32.A

## 2015-12-12 LAB — POCT FERN TEST: POCT Fern Test: POSITIVE

## 2015-12-12 LAB — RPR: RPR: NONREACTIVE

## 2015-12-12 LAB — CBC
HCT: 39.7 % (ref 36.0–46.0)
Hemoglobin: 13.8 g/dL (ref 12.0–15.0)
MCH: 31.1 pg (ref 26.0–34.0)
MCHC: 34.8 g/dL (ref 30.0–36.0)
MCV: 89.4 fL (ref 78.0–100.0)
Platelets: 255 10*3/uL (ref 150–400)
RBC: 4.44 MIL/uL (ref 3.87–5.11)
RDW: 13.4 % (ref 11.5–15.5)
WBC: 9 10*3/uL (ref 4.0–10.5)

## 2015-12-12 LAB — TYPE AND SCREEN
ABO/RH(D): A POS
ANTIBODY SCREEN: NEGATIVE

## 2015-12-12 LAB — CULTURE, BETA STREP (GROUP B ONLY)

## 2015-12-12 LAB — ABO/RH: ABO/RH(D): A POS

## 2015-12-12 MED ORDER — FENTANYL CITRATE (PF) 100 MCG/2ML IJ SOLN: 100.0000 ug | INTRAMUSCULAR | Status: DC | PRN

## 2015-12-12 MED ORDER — IBUPROFEN 600 MG PO TABS
600.0000 mg | ORAL_TABLET | Freq: Four times a day (QID) | ORAL | Status: DC | PRN
  Administered 2015-12-12 – 2015-12-14 (×9): 600 mg via ORAL
  Filled 2015-12-12 (×9): qty 1

## 2015-12-12 MED ORDER — PRENATAL MULTIVITAMIN CH
1.0000 | ORAL_TABLET | Freq: Every day | ORAL | Status: DC
  Administered 2015-12-12 – 2015-12-14 (×3): 1 via ORAL
  Filled 2015-12-12 (×3): qty 1

## 2015-12-12 MED ORDER — ONDANSETRON HCL 4 MG PO TABS: 4.0000 mg | ORAL_TABLET | ORAL | Status: DC | PRN

## 2015-12-12 MED ORDER — OXYTOCIN 10 UNIT/ML IJ SOLN
2.5000 [IU]/h | INTRAVENOUS | Status: DC
  Administered 2015-12-12: 39.96 [IU]/h via INTRAVENOUS
  Filled 2015-12-12: qty 4

## 2015-12-12 MED ORDER — FLUOXETINE HCL 20 MG PO CAPS
40.0000 mg | ORAL_CAPSULE | Freq: Every day | ORAL | Status: DC
  Administered 2015-12-12 – 2015-12-14 (×3): 40 mg via ORAL
  Filled 2015-12-12 (×4): qty 2

## 2015-12-12 MED ORDER — ONDANSETRON HCL 4 MG/2ML IJ SOLN: 4.0000 mg | INTRAMUSCULAR | Status: DC | PRN

## 2015-12-12 MED ORDER — CITRIC ACID-SODIUM CITRATE 334-500 MG/5ML PO SOLN: 30.0000 mL | ORAL | Status: DC | PRN

## 2015-12-12 MED ORDER — FENTANYL 2.5 MCG/ML BUPIVACAINE 1/10 % EPIDURAL INFUSION (WH - ANES): 14.0000 mL/h | INTRAMUSCULAR | Status: DC | PRN

## 2015-12-12 MED ORDER — DIPHENHYDRAMINE HCL 50 MG/ML IJ SOLN: 12.5000 mg | INTRAMUSCULAR | Status: DC | PRN

## 2015-12-12 MED ORDER — EPHEDRINE 5 MG/ML INJ
10.0000 mg | INTRAVENOUS | Status: DC | PRN
Start: 2015-12-12 — End: 2015-12-12

## 2015-12-12 MED ORDER — BENZOCAINE-MENTHOL 20-0.5 % EX AERO
1.0000 "application " | INHALATION_SPRAY | CUTANEOUS | Status: DC | PRN
  Administered 2015-12-12: 1 via TOPICAL
  Filled 2015-12-12: qty 56

## 2015-12-12 MED ORDER — BETAMETHASONE SOD PHOS & ACET 6 (3-3) MG/ML IJ SUSP
12.0000 mg | INTRAMUSCULAR | Status: DC
  Administered 2015-12-12: 12 mg via INTRAMUSCULAR
  Filled 2015-12-12: qty 2

## 2015-12-12 MED ORDER — LACTATED RINGERS IV SOLN
INTRAVENOUS | Status: DC
  Administered 2015-12-12: 06:00:00 via INTRAVENOUS

## 2015-12-12 MED ORDER — OXYCODONE-ACETAMINOPHEN 5-325 MG PO TABS: 2.0000 | ORAL_TABLET | ORAL | Status: DC | PRN

## 2015-12-12 MED ORDER — PHENYLEPHRINE 40 MCG/ML (10ML) SYRINGE FOR IV PUSH (FOR BLOOD PRESSURE SUPPORT): 80.0000 ug | PREFILLED_SYRINGE | INTRAVENOUS | Status: DC | PRN

## 2015-12-12 MED ORDER — TETANUS-DIPHTH-ACELL PERTUSSIS 5-2.5-18.5 LF-MCG/0.5 IM SUSP: 0.5000 mL | Freq: Once | INTRAMUSCULAR | Status: DC

## 2015-12-12 MED ORDER — ACETAMINOPHEN 325 MG PO TABS: 650.0000 mg | ORAL_TABLET | ORAL | Status: DC | PRN

## 2015-12-12 MED ORDER — DIPHENHYDRAMINE HCL 25 MG PO CAPS
25.0000 mg | ORAL_CAPSULE | Freq: Four times a day (QID) | ORAL | Status: DC | PRN
Start: 2015-12-12 — End: 2015-12-14

## 2015-12-12 MED ORDER — OXYTOCIN BOLUS FROM INFUSION: 500.0000 mL | INTRAVENOUS | Status: DC

## 2015-12-12 MED ORDER — MESALAMINE 1.2 G PO TBEC
2.4000 g | DELAYED_RELEASE_TABLET | Freq: Every day | ORAL | Status: DC
  Administered 2015-12-12 – 2015-12-14 (×3): 2.4 g via ORAL
  Filled 2015-12-12 (×4): qty 2

## 2015-12-12 MED ORDER — SENNOSIDES-DOCUSATE SODIUM 8.6-50 MG PO TABS
2.0000 | ORAL_TABLET | ORAL | Status: DC
  Administered 2015-12-13 (×2): 2 via ORAL
  Filled 2015-12-12 (×2): qty 2

## 2015-12-12 MED ORDER — LACTATED RINGERS IV SOLN: 500.0000 mL | INTRAVENOUS | Status: DC | PRN

## 2015-12-12 MED ORDER — PENICILLIN G POTASSIUM 5000000 UNITS IJ SOLR
5.0000 10*6.[IU] | Freq: Once | INTRAVENOUS | Status: AC
  Administered 2015-12-12: 5 10*6.[IU] via INTRAVENOUS
  Filled 2015-12-12: qty 5

## 2015-12-12 MED ORDER — LIDOCAINE HCL (PF) 1 % IJ SOLN
30.0000 mL | INTRAMUSCULAR | Status: AC | PRN
  Administered 2015-12-12: 30 mL via SUBCUTANEOUS
  Filled 2015-12-12: qty 30

## 2015-12-12 MED ORDER — EPHEDRINE 5 MG/ML INJ: 10.0000 mg | INTRAVENOUS | Status: DC | PRN

## 2015-12-12 MED ORDER — COCONUT OIL OIL: 1.0000 "application " | TOPICAL_OIL | Status: DC | PRN

## 2015-12-12 MED ORDER — ZOLPIDEM TARTRATE 5 MG PO TABS: 5.0000 mg | ORAL_TABLET | Freq: Every evening | ORAL | Status: DC | PRN

## 2015-12-12 MED ORDER — OXYCODONE-ACETAMINOPHEN 5-325 MG PO TABS: 1.0000 | ORAL_TABLET | ORAL | Status: DC | PRN

## 2015-12-12 MED ORDER — LACTATED RINGERS IV SOLN: 500.0000 mL | Freq: Once | INTRAVENOUS | Status: DC

## 2015-12-12 MED ORDER — PENICILLIN G POTASSIUM 5000000 UNITS IJ SOLR
2.5000 10*6.[IU] | INTRAVENOUS | Status: DC
  Filled 2015-12-12 (×2): qty 2.5

## 2015-12-12 MED ORDER — PHENYLEPHRINE 40 MCG/ML (10ML) SYRINGE FOR IV PUSH (FOR BLOOD PRESSURE SUPPORT)
80.0000 ug | PREFILLED_SYRINGE | INTRAVENOUS | Status: DC | PRN
Start: 2015-12-12 — End: 2015-12-12

## 2015-12-12 MED ORDER — BUPROPION HCL ER (SR) 150 MG PO TB12
150.0000 mg | ORAL_TABLET | Freq: Every day | ORAL | Status: DC
  Administered 2015-12-12 – 2015-12-14 (×3): 150 mg via ORAL
  Filled 2015-12-12 (×4): qty 1

## 2015-12-12 MED ORDER — ONDANSETRON HCL 4 MG/2ML IJ SOLN: 4.0000 mg | Freq: Four times a day (QID) | INTRAMUSCULAR | Status: DC | PRN

## 2015-12-12 MED ORDER — SIMETHICONE 80 MG PO CHEW: 80.0000 mg | CHEWABLE_TABLET | ORAL | Status: DC | PRN

## 2015-12-12 NOTE — MAU Note (Addendum)
PT  SAYS AT 0315  SHE WAS LYING ON SOFA-  FELT  FLUID  LEAKING.     FLUID ID STILL LEAKING  SOME.   [facility]-  WITH [facility]  [place].   VE 1-2 CM.    GBS- STILL PENDING.

## 2015-12-12 NOTE — Lactation Note (Signed)
This note was copied from a baby's chart. Lactation Consultation Note  Patient Name: Adriana Frazier QMVHQ'IToday's Date: 12/12/2015 Reason for consult: Initial assessment Baby at 9 hr of life and mom is resting. She requested that lactation come back later. Left handouts and encouraged her to call at next feeding.   Maternal Data    Feeding Feeding Type: Breast Milk Length of feed: 10 min  LATCH Score/Interventions Latch: Repeated attempts needed to sustain latch, nipple held in mouth throughout feeding, stimulation needed to elicit sucking reflex. Intervention(s): Adjust position;Assist with latch;Breast massage;Breast compression  Audible Swallowing: None Intervention(s): Skin to skin;Hand expression Intervention(s): Skin to skin;Hand expression  Type of Nipple: Everted at rest and after stimulation  Comfort (Breast/Nipple): Soft / non-tender     Hold (Positioning): Assistance needed to correctly position infant at breast and maintain latch. Intervention(s): Breastfeeding basics reviewed;Support Pillows;Position options;Skin to skin  LATCH Score: 6  Lactation Tools Discussed/Used     Consult Status Consult Status: Follow-up Date: 12/12/15 Follow-up type: In-patient    Rulon Eisenmengerlizabeth E Kamir Selover 12/12/2015, 6:52 PM

## 2015-12-12 NOTE — MAU Note (Signed)
Pt presents with complaint of ROM at 0315, some contractions

## 2015-12-12 NOTE — H&P (Signed)
OBSTETRIC ADMISSION HISTORY AND PHYSICAL  Adriana Frazier is a 35 y.o. female G2P1001 with IUP at [redacted]w[redacted]d by 7wk presenting for PPROM. She reports +FMs, no VB, no blurry vision, headaches or peripheral edema, and RUQ pain.  She plans on breast feeding. She is undecided for birth control.  Clinic  [facility] Prenatal Labs  Dating  7 week [place] Blood type: A/POS/-- (10/07 1035)   Genetic Screen 1 Screen: Declined AFP:  nml NIPS: Harmony neg-female Antibody:NEG (10/07 1035)  Anatomic [place] [employer] resolved; bilateral pyelectasis; present at 4//4/17 delviery.  Needs f/u Rubella: 1.63 (10/07 1035)  GTT   Third trimester: 93 RPR: NON REAC (10/07 1035)   Flu vaccine 04/2015 HBsAg: NEGATIVE (10/07 1035)   TDaP vaccine 10/15/15                                   Rhogam: NA HIV: NONREACTIVE (10/07 1035)   Baby Food  Breast                               GBS: (For PCN allergy, check sensitivities)  Contraception   Pap:  Negative  Circumcision IP   Pediatrician Alexis Frock (will use [facility])   Support Person Minerva Areola (husband)    Prenatal History/Complications:  Past Medical History: Past Medical History  Diagnosis Date  . Ulcerative colitis [facility])     Past Surgical History: Past Surgical History  Procedure Laterality Date  . Cyst in neck injected with radiation    . Colonoscopy    . Sigmoidoscopy      Obstetrical History: OB History    Gravida Para Term Preterm AB TAB SAB Ectopic Multiple Living   Social History: Social History   Social History  . Marital Status: Married    Spouse Name: N/A  . Number of Children: 1   . Years of Education: N/A   Occupational History  . therapist     Social History Main Topics  . Smoking status: Never Smoker   . Smokeless tobacco: Never Used  . Alcohol Use: 0.0 oz/week    0 Standard drinks or equivalent per week     Comment: rarely.   . Drug Use: No  . Sexual Activity: Yes    Birth Control/ Protection: None   Other Topics Concern  .  None   Social History Narrative   Works out about 5 days per week.  No regular caffeine.     Family History: Family History  Problem Relation Age of Onset  . Rheum arthritis Mother   . Depression Mother   . Hyperlipidemia Father   . Bipolar disorder Father   . Bipolar disorder Sister   . Bipolar disorder Paternal Uncle   . Colon cancer      grandparents.     Allergies: Allergies  Allergen Reactions  . Shellfish Allergy     Prescriptions prior to admission  Medication Sig Dispense Refill Last Dose  . buPROPion (WELLBUTRIN SR) 150 MG 12 hr tablet Take 1 tablet (150 mg total) by mouth daily. 90 tablet 4 12/11/2015 at Unknown time  . Cholecalciferol (VITAMIN D-3) 1000 UNITS CAPS Take by mouth daily.   12/11/2015 at Unknown time  . FLUoxetine (PROZAC) 40 MG capsule Take 1 capsule (40 mg total) by mouth daily. 90  capsule 4 12/11/2015 at Unknown time  . mesalamine (LIALDA) 1.2 G EC tablet Take by mouth daily with breakfast. 2 tabs   12/11/2015 at Unknown time  . Omega 3-6-9 Fatty Acids (OMEGA 3-6-9 PO) Take 1,600 mg by mouth daily.   12/11/2015 at Unknown time  . Prenatal Vit-Fe Fumarate-FA (PRENATAL VITAMIN PO) Take by mouth.   12/11/2015 at Unknown time     Review of Systems   All systems reviewed and negative except as stated in HPI  Blood pressure 103/76, pulse 98, temperature 98.2 F (36.8 C), temperature source Oral, resp. rate 20, height 5\' 3"  (1.6 m), weight 188 lb (85.276 kg), last menstrual period 04/02/2015. General appearance: alert, cooperative and appears stated age Lungs: clear to auscultation bilaterally Heart: regular rate and rhythm Abdomen: soft, non-tender; bowel sounds normal Pelvic: adequate Extremities: Homans sign is negative, no sign of DVT DTR's wnl Presentation: cephalic Fetal monitoringBaseline: 135 bpm, Variability: Good {> 6 bpm), Accelerations: Reactive and Decelerations: Absent Uterine activityFrequency: Every 2-3 minutes Dilation: 3 Effacement  (%): 80 Station: -2 Exam by:: BENJI, RN   Prenatal labs: ABO, Rh: A/POS/-- (10/07 1035) Antibody: NEG (10/07 1035) Rubella: !Error! RPR: NON REAC (02/27 0935)  HBsAg: NEGATIVE (10/07 1035)  HIV: NONREACTIVE (02/27 0935)  GBS:    1 hr Glucola wnl Genetic screening  NIPT wnl Anatomy US- bilateral pyelectasis  Prenatal Transfer Tool  Maternal Diabetes: No Genetic Screening: Normal Maternal Ultrasounds/Referrals: Abnormal:  Findings:   Fetal Kidney Anomalies Fetal Ultrasounds or other Referrals:  None Maternal Substance Abuse:  No Significant Maternal Medications:  Meds include: Prozac Other: Buspar, mesalamine Significant Maternal Lab Results: Lab values include: Other: GBS unknown  Results for orders placed or performed during the hospital encounter of 12/12/15 (from the past 24 hour(s))  [facility]   Collection Time: 12/12/15  5:23 AM  Result Value Ref Range   POCT Fern Test Positive = ruptured amniotic membanes     Patient Active Problem List   Diagnosis Date Noted  . Preterm premature rupture of membranes (PPROM) with onset of labor within 24 hours of rupture in third trimester, antepartum 12/12/2015  . Ultrasound recheck of fetal pyelectasis, antepartum 11/25/2015  . Supervision of other high risk pregnancies, first trimester 06/25/2015  . AMA (advanced maternal age) multigravida 35+ 05/25/2015  . Depression 08/04/2014  . Generalized anxiety disorder 08/04/2014  . Ulcerative colitis (HCC) 08/04/2014  . Mass of left side of neck 08/04/2014  . Excess or deficiency of vitamin D 11/02/2013    Assessment: Adriana Frazier is a 35 y.o. G2P1001 at 7587w2d here for PPROM   #Labor: Expectant management. Augment with pit if needed.  #Pain:  prn fentanyl. May have epidural if desired #FWB: Cat I, BMZ ordered given preterm status #ID:  GBS unknown, PCN started empirically given preterm status, await cx results #MOF: breast #MOC: undecided #Circ:  Female,   #Ulcerative Colitis:  continue mesalamine #Depression/Anxiety: Continue fluoxetine and buspar. Plan on 2 week appt for mood assessment.   Federico FlakeKimberly Niles Tailynn Armetta, MD  12/12/2015, 5:31 AM  Attending physician: Tinnie Gensanya Pratt MD

## 2015-12-13 NOTE — Discharge Summary (Signed)
OB Discharge Summary     Patient Name: Adriana Frazier DOB: 1981-01-26 MRN: [id number]  Date of admission: 12/12/2015 Delivering MD: Courtney Paris   Date of discharge: 12/14/2015  Admitting diagnosis: 36 WEEKS POSSIBLE ROM Intrauterine pregnancy: [redacted]w[redacted]d     Secondary diagnosis:  Principal Problem:   Preterm premature rupture of membranes (PPROM) with onset of labor within 24 hours of rupture in third trimester, antepartum Active Problems:   Depression   Generalized anxiety disorder   Ulcerative colitis (HCC)   AMA (advanced maternal age) multigravida 35+   SVD (spontaneous vaginal delivery)  Additional problems: none     Discharge diagnosis: Preterm Pregnancy Delivered                                                                                                Post partum procedures:none  Augmentation: none  Complications: None  Hospital course:  Onset of Labor With Vaginal Delivery     35 y.o. yo G2P1101 at [redacted]w[redacted]d was admitted in Active Labor on 12/12/2015. Patient had an uncomplicated labor course as follows:  Membrane Rupture Time/Date: 3:15 AM ,12/12/2015   Intrapartum Procedures: Episiotomy: None [1]                                         Lacerations:  1st degree [2];Periurethral [8]  Patient had a delivery of a Viable infant. 12/12/2015  Information for the patient's newborn:  Winston, Sobczyk [[id number]]  Delivery Method: Vaginal, Spontaneous Delivery (Filed from Delivery Summary)     Pateint had an uncomplicated postpartum course.  She is ambulating, tolerating a regular diet, passing flatus, and urinating well. Patient is discharged home in stable condition on 12/14/2015.    Physical exam  Filed Vitals:   12/12/15 1643 12/13/15 0023 12/13/15 1800 12/14/15 0655  BP: 103/68 99/54 104/65 106/71  Pulse: 71 79 72   Temp: 97.2 F (36.2 C) 98.3 F (36.8 C) 97 F (36.1 C) 97.9 F (36.6 C)  TempSrc: Oral Oral Oral Oral  Resp: Height:       Weight:      SpO2:   97%    General: alert, cooperative and no distress Lochia: appropriate Uterine Fundus: firm Incision: N/A DVT Evaluation: No evidence of DVT seen on physical exam. Labs: Lab Results  Component Value Date   WBC 9.0 12/12/2015   HGB 13.8 12/12/2015   HCT 39.7 12/12/2015   MCV 89.4 12/12/2015   PLT 255 12/12/2015   CMP Latest Ref Rng 02/07/2015  Glucose 70 - 99 mg/dL 84  BUN 6 - 23 mg/dL 8  Creatinine 1.30 - 8.65 mg/dL 7.84  Sodium 696 - 295 mEq/L 138  Potassium 3.5 - 5.3 mEq/L 4.0  Chloride 96 - 112 mEq/L 103  CO2 19 - 32 mEq/L 27  Calcium 8.4 - 10.5 mg/dL 9.3  Total Protein 6.0 - 8.3 g/dL 6.9  Total Bilirubin 0.2 - 1.2 mg/dL 0.6  Alkaline Phos 39 - 117 U/L 45  AST 0 - 37 U/L 17  ALT 0 - 35 U/L 17    Discharge instruction: per After Visit Summary and "Baby and Me Booklet".  After visit meds:    Medication List    TAKE these medications        buPROPion 150 MG 12 hr tablet  Commonly known as:  WELLBUTRIN SR  Take 1 tablet (150 mg total) by mouth daily.     FLUoxetine 40 MG capsule  Commonly known as:  PROZAC  Take 1 capsule (40 mg total) by mouth daily.     ibuprofen 600 MG tablet  Commonly known as:  ADVIL,MOTRIN  Take 1 tablet (600 mg total) by mouth every 6 (six) hours as needed for moderate pain.     mesalamine 1.2 g EC tablet  Commonly known as:  LIALDA  Take 2.4 g by mouth daily with breakfast. 2 tabs     norethindrone 0.35 MG tablet  Commonly known as:  ORTHO MICRONOR  Take 1 tablet (0.35 mg total) by mouth daily.     OMEGA 3-6-9 PO  Take 1,600 mg by mouth daily.     prenatal multivitamin Tabs tablet  Take 1 tablet by mouth daily at 12 noon.     Vitamin D-3 1000 units Caps  Take 1,000 Units by mouth daily.        Diet: routine diet  Activity: Advance as tolerated. Pelvic rest for 6 weeks.   Outpatient follow up:2 weeks Follow up Appt:No future appointments. Follow up Visit:No Follow-up on file.  Postpartum  contraception: Progesterone only pills  Newborn Data: Live born female  Birth Weight: 6 lb 7.9 oz (2945 g) APGAR: 8, 9  Baby Feeding: Breast Disposition:home with mother   12/14/2015 Greig RightRESENZO-DISHMAN,Santino Kinsella, CNM

## 2015-12-13 NOTE — Discharge Instructions (Signed)

## 2015-12-13 NOTE — Lactation Note (Signed)
This note was copied from a baby's chart. Lactation Consultation Note  Patient Name: Adriana Dominic PeaSarah Fredericks ZOXWR'UToday's Date: 12/13/2015 Reason for consult: Follow-up assessment;Late preterm infant Checked in on Mom, baby 7327 hrs old.  Mom had baby latched in cradle hold, without any pillow support.  Baby latched onto nipple, with cheek dimpling noted with every suck.  Offered to assist with using cross cradle hold.  Mom has large in length nipples.  Talked about importance of baby latching deeper than the nipples for comfort and for adequate milk intake.   After a couple attempts, and using pillow support, baby was able to latch on deeply, and use deep jaw extensions.  Taught Mom how to listen and watch for swallowing.  Encouraged her to use alternate breast compression to facilitate milk transfer.  Mom had recently pumped using the initiation cycle and obtained 38 ml of colostrum.  Recommended she supplement breast feeding with her expressed milk.  He only needs 10 ml, so recommended she refrigerate the remainder.  Recommended skin to skin, and cue based feedings.  Goal is to feed baby >8 times in 24 hrs, so to place baby skin to skin if he's been sleeping 3 hrs. To ask for help as needed, and LC to follow up in am. Consult Status Consult Status: Follow-up Date: 12/14/15 Follow-up type: In-patient    Judee ClaraSmith, Jahnae Mcadoo E 12/13/2015, 12:25 PM

## 2015-12-13 NOTE — Progress Notes (Signed)
POSTPARTUM PROGRESS NOTE  Post Partum Day 1 Subjective:  Adriana Frazier is a 35 y.o. G2P1101 1979w2d s/p SVD after PPROM.  No acute events overnight.  Pt denies problems with ambulating, voiding or po intake.  She denies nausea or vomiting.  Pain is well controlled.  She has had flatus. She has not had bowel movement.  Lochia slightly more than typical menstrual period.   Objective: Blood pressure 99/54, pulse 79, temperature 98.3 F (36.8 C), temperature source Oral, resp. rate 18, height 5\' 3"  (1.6 m), weight 85.276 kg (188 lb), last menstrual period 04/02/2015, SpO2 97 %, unknown if currently breastfeeding.  Physical Exam:  General: alert, cooperative and no distress Lochia:normal flow Chest: CTAB Heart: RRR no m/r/g Abdomen: +BS, soft, nontender,  Uterine Fundus: firm, at the level of the umbilicus DVT Evaluation: No calf swelling or tenderness Extremities: no edema   Recent Labs  12/12/15 0530  HGB 13.8  HCT 39.7    Assessment/Plan:  ASSESSMENT: Adriana Frazier is a 35 y.o. [id number] 5979w2d s/p SVD after PPROM, doing well on PPD#1 with no acute concerns. She requests a Advertising copywriterlactation consultant to work on her latch before d/c. She also plans to circumcise her son in the hospital before leaving and reports that someone is scheduled to stop by today.  Breastfeeding and Lactation consult with circumcision before d/c (possible late afternoon).   LOS: 1 day   Adriana Frazier 12/13/2015, 7:29 AM    CNM attestation Post Partum Day #1  Adriana Frazier is a 35 y.o. G2P1101 s/p SVD.  Pt denies problems with ambulating, voiding or po intake. Pain is well controlled.  Plan for birth control is undecided.  Method of Feeding: breast  PE:  BP 99/54 mmHg  Pulse 79  Temp(Src) 98.3 F (36.8 C) (Oral)  Resp 18  Ht 5\' 3"  (1.6 m)  Wt 85.276 kg (188 lb)  BMI 33.31 kg/m2  SpO2 97%  LMP 04/02/2015  Breastfeeding? Unknown Fundus firm  Plan for discharge: 12/14/15. Circ to be done later  today.  Cam HaiSHAW, KIMBERLY, CNM 9:21 AM  12/13/2015

## 2015-12-14 ENCOUNTER — Ambulatory Visit: Payer: Self-pay

## 2015-12-14 MED ORDER — NORETHINDRONE 0.35 MG PO TABS: 1.0000 | ORAL_TABLET | Freq: Every day | ORAL | Status: DC

## 2015-12-14 MED ORDER — IBUPROFEN 600 MG PO TABS: 600.0000 mg | ORAL_TABLET | Freq: Four times a day (QID) | ORAL | Status: DC | PRN

## 2015-12-14 NOTE — Lactation Note (Signed)
This note was copied from a baby's chart. Lactation Consultation Note  Mom reports a history of nipple pain with her older child that led to pumping and bottle feeding. This baby,[name], is not opening wide for feedings. Oral evaluation reveals a tight mouth and a lingual frenum is noted. He does not flange his upper lip well.  Attempted to BF and he did attach once but it was so painful that mom quickly took him off without attempted to correct the latch.  He would not open again and went to sleep.  [facility] Patient Name: Adriana Dominic PeaSarah Neuhaus RUEAV'WToday's Date: 12/14/2015 Reason for consult: Follow-up assessment   Maternal Data    Feeding Feeding Type: Breast Fed  LATCH Score/Interventions Latch: Too sleepy or reluctant, no latch achieved, no sucking elicited. Intervention(s): Assist with latch;Adjust position  Audible Swallowing: None Intervention(s): Skin to skin  Type of Nipple: Everted at rest and after stimulation  Comfort (Breast/Nipple): Filling, red/small blisters or bruises, mild/mod discomfort  Problem noted: Mild/Moderate discomfort  Hold (Positioning): No assistance needed to correctly position infant at breast.  LATCH Score: 5  Lactation Tools Discussed/Used     Consult Status      Soyla DryerJoseph, Marrion Finan 12/14/2015, 5:55 PM

## 2015-12-14 NOTE — Lactation Note (Signed)
This note was copied from a baby's chart. Lactation Consultation Note; Baby was circ'd about 1 hour ago he is asleep in bassinet, Mom pumping as I went into room. Obtaining transitional milk. Using #24 flanges- they look too small. #27 flange tried and mom reports that does feel better. To call for assist when baby ready to feed.   Patient Name: Adriana Frazier'OToday's Date: 12/14/2015 Reason for consult: Follow-up assessment;Late preterm infant   Maternal Data Formula Feeding for Exclusion: No Does the patient have breastfeeding experience prior to this delivery?: Yes  Feeding Length of feed: 25 min  LATCH Score/Interventions Latch: Grasps breast easily, tongue down, lips flanged, rhythmical sucking.  Audible Swallowing: Spontaneous and intermittent Intervention(s): Skin to skin  Type of Nipple: Everted at rest and after stimulation  Comfort (Breast/Nipple): Filling, red/small blisters or bruises, mild/mod discomfort  Problem noted: Mild/Moderate discomfort  Hold (Positioning): Assistance needed to correctly position infant at breast and maintain latch.  LATCH Score: 8  Lactation Tools Discussed/Used     Consult Status Consult Status: Follow-up Date: 12/14/15 Follow-up type: In-patient    Pamelia HoitWeeks, Muzammil Bruins D 12/14/2015, 11:28 AM

## 2015-12-14 NOTE — Lactation Note (Signed)
This note was copied from a baby's chart. Lactation Consultation Note  Mother called for assistance w/ breastfeeding.  Upon entering baby latched in football hold. Rhythmical sucks and swallows observed for more than 10 min.  When baby became sleepy, demonstrated how to compress/massage breast to keep him active. When baby unlatched, mother's nipple has a ridge possibly due to frenulum.  Mother states pain of 4 but seems to tolerate feeding well. Suggest applying ebm and coconut oil after breastfeeding. Had mother relatch baby waiting for wide open mouth and to reassure her that she can independently breastfeed her baby.   Recommend breastfeeding on both breasts unless too sore and then mother can breastfeed on one breast and pump the other to give sore nipple a change to rest and heal. Mother has been post pumping approx 30 ml.  Praised mother for her efforts.    Patient Name: Adriana Frazier RUEAV'WToday's Date: 12/14/2015 Reason for consult: Follow-up assessment   Maternal Data    Feeding Feeding Type: Breast Fed Length of feed: 15 min  LATCH Score/Interventions Latch: Grasps breast easily, tongue down, lips flanged, rhythmical sucking. Intervention(s): Adjust position;Assist with latch;Breast massage  Audible Swallowing: Spontaneous and intermittent Intervention(s): Skin to skin  Type of Nipple: Everted at rest and after stimulation  Comfort (Breast/Nipple): Filling, red/small blisters or bruises, mild/mod discomfort  Problem noted: Mild/Moderate discomfort Interventions (Mild/moderate discomfort): Hand expression;Post-pump (coconut oil)  Hold (Positioning): Assistance needed to correctly position infant at breast and maintain latch.  LATCH Score: 8  Lactation Tools Discussed/Used     Consult Status Consult Status: Follow-up Date: 12/15/15 Follow-up type: In-patient    [name], Ruth [facility] HospitalBoschen 12/14/2015, 7:59 PM

## 2015-12-14 NOTE — Lactation Note (Signed)
This note was copied from a baby's chart. Lactation Consultation Note; Baby asleep in bassinet at present. Mom reports he fed about 1 hour ago. Reports her nipples are a little sore- not getting a deep latch. Encouraged to call for assist when he wakes for next feeding. Plans to get pump from our office at [place].  Patient Name: Adriana Dominic PeaSarah Botto UJWJX'BToday's Date: 12/14/2015 Reason for consult: Follow-up assessment;Late preterm infant   Maternal Data Formula Feeding for Exclusion: No Does the patient have breastfeeding experience prior to this delivery?: Yes  Feeding Length of feed: 25 min  LATCH Score/Interventions Latch: Grasps breast easily, tongue down, lips flanged, rhythmical sucking.  Audible Swallowing: Spontaneous and intermittent Intervention(s): Skin to skin  Type of Nipple: Everted at rest and after stimulation  Comfort (Breast/Nipple): Filling, red/small blisters or bruises, mild/mod discomfort  Problem noted: Mild/Moderate discomfort  Hold (Positioning): Assistance needed to correctly position infant at breast and maintain latch.  LATCH Score: 8  Lactation Tools Discussed/Used     Consult Status Consult Status: Follow-up Date: 12/14/15 Follow-up type: In-patient    Pamelia HoitWeeks, Aissata Wilmore D 12/14/2015, 8:57 AM

## 2015-12-15 ENCOUNTER — Ambulatory Visit: Payer: Self-pay

## 2015-12-15 NOTE — Lactation Note (Signed)
This note was copied from a baby's chart. Lactation Consultation Note  Patient Name: Adriana Dominic PeaSarah Brethauer BJYNW'GToday's Date: 12/15/2015 Reason for consult: Follow-up assessment;Late preterm infant 6073 hours old, baby patient and will be D/C today. Was kept has a baby patient due to weight loss,  Weight increased in the last 24 hours. Moms milk in and is able to pump off 3 oz after feeding per mom.  Baby last fed at 0730. Due to baby being a late pre term infant , diaper checked , large wet diaper changed,  And LC assisted with latch after Dr. Francia GreavesExam in football position. Depth achieved and per mom comfortable , multiply swallows noted and  Breast softened well after 15 mins of feeding. Baby released and nipple slightly misshaped. Per mom better than it has been . LC had mom Hand express some milk off to make the areola more compressible for a thinner sandwich. Transitional stool after feeding.  Sore nipple and engorgement prevention and tx reviewed. Per mom will have a DEBP at home. And has pumped x 3 in the last 24 hours.  LC checked baby's oral cavity with gloved finger and noted a short labial frenulum above the gum line , upper lip stretches with exam and at the breast .  Recessed chin. Short anterior frenulum noted, baby able to extend tongue over gum line short distance, and elevate tongue up ward. LC offered mom LC O/P appt. And mom receptive to coming back 5/3 at 1 pm Wednesday. Appt. Reminder given to mom.  LC breast assessment with moms permission - no break down of nipples and milk is in - full no engorgement.   Maternal Data Has patient been taught Hand Expression?: Yes  Feeding Feeding Type: Breast Fed Length of feed: 15 min (LC observed latch and feeding , multiply swallows, )  LATCH Score/Interventions Latch: Grasps breast easily, tongue down, lips flanged, rhythmical sucking. Intervention(s): Skin to skin;Teach feeding cues;Waking techniques Intervention(s): Adjust position;Assist  with latch;Breast massage;Breast compression  Audible Swallowing: Spontaneous and intermittent  Type of Nipple: Everted at rest and after stimulation  Comfort (Breast/Nipple): Filling, red/small blisters or bruises, mild/mod discomfort  Problem noted: Filling  Hold (Positioning): Assistance needed to correctly position infant at breast and maintain latch. Intervention(s): Breastfeeding basics reviewed;Support Pillows;Position options;Skin to skin  LATCH Score: 8  Lactation Tools Discussed/Used WIC Program: No Pump Review: Setup, frequency, and cleaning   Consult Status Consult Status: Follow-up Date: 12/19/15 Follow-up type: Out-patient    Kathrin Greathouseorio, Chena Chohan Ann 12/15/2015, 10:47 AM

## 2015-12-19 ENCOUNTER — Ambulatory Visit (HOSPITAL_COMMUNITY)
Admission: RE | Admit: 2015-12-19 | Discharge: 2015-12-19 | Disposition: A | Discharge status: Home / Self Care | Payer: 59 | Source: Ambulatory Visit | Attending: Obstetrics & Gynecology | Admitting: Obstetrics & Gynecology

## 2015-12-19 NOTE — Lactation Note (Signed)
Lactation Consult  Mother's reason for visit: Mother was scheduled for follow up on discharge from hospital Visit Type: feeding assessment  Consult:  Initial Lactation Consultant:  Michel BickersKendrick, Akosua Constantine McCoy  ________________________________________________________________________    ________________________________________________________________________  Mother's Name: Dominic PeaSarah Corne Type of delivery:  vaginal del Breastfeeding Experience:  2 months with breastfeeding and pumping for 2 months Maternal Medical Conditions:  baby blues after last child, history of depression when mother was in her first year of college and has continued with depression on and off  Maternal Medications:  Fluoxitine,Wellbrutrin, Lialda, prenatal vits  ________________________________________________________________________  Breastfeeding History (Post Discharge)  Frequency of breastfeeding:2-3 hours Duration of feeding: 15-30 mins  Patient does not supplement or pump.  Infant Intake and Output Assessment  Voids:  8 in 24 hrs.  Color:  Clear yellow Stools:  5 in 24 hrs.  Color:  Yellow  ________________________________________________________________________  Maternal Breast Assessment  Breast:  Full Nipple:  Erect Pain level:  0 Pain interventions:  Bra and Expressed breast milk  _______________________________________________________________________ Mother denies having any baby blues or depression at this time. We did review coping behaviors if she becomes depressed.   Initial feeding assessment: Mother independent latched infant on the (R) breast. Observed good burst of rhythmic sucklings with audible swallows. Infant  Sustained latch for 25 mins. Transferred 52 ml.   Infant's oral assessment:  WNL  Positioning:  Football Right breast  LATCH documentation:  Latch:  2 = Grasps breast easily, tongue down, lips flanged, rhythmical sucking.  Audible swallowing:  2 = Spontaneous and  intermittent  Type of nipple:  2 = Everted at rest and after stimulation  Comfort (Breast/Nipple):  1 = Filling, red/small blisters or bruises, mild/mod discomfort  Hold (Positioning):  1 = Assistance needed to correctly position infant at breast and maintain latch  LATCH score:  8  Attached assessment:  Shallow  Lips flanged:  Yes.    Lips untucked:  Yes.    Suck assessment:  Displays both   Pre-feed weight: 9-6.0,45406-8.6,2966  -Post-feed weight:  6-10.5, 3018 Amount transferred:  52 ml   Total amount transferred:  52 ml Lots of praise and support given to mother.

## 2015-12-24 ENCOUNTER — Encounter: Payer: 59 | Admitting: Advanced Practice Midwife

## 2015-12-25 ENCOUNTER — Ambulatory Visit (INDEPENDENT_AMBULATORY_CARE_PROVIDER_SITE_OTHER): Payer: 59 | Admitting: Obstetrics & Gynecology

## 2015-12-25 ENCOUNTER — Encounter: Payer: Self-pay | Admitting: Obstetrics & Gynecology

## 2015-12-25 VITALS — BP 106/73 | HR 69 | Resp 16 | Ht 65.0 in | Wt 167.0 lb

## 2015-12-25 DIAGNOSIS — F32A Depression, unspecified: Secondary | ICD-10-CM

## 2015-12-25 DIAGNOSIS — F329 Major depressive disorder, single episode, unspecified: Secondary | ICD-10-CM

## 2015-12-25 NOTE — Progress Notes (Signed)
   Subjective:    Patient ID: Dominic PeaSarah Laubscher, female    DOB: 1981/05/18, 35 y.o.   MRN: [id number]  HPI 35 yo MW P612 (512 week old son and 243 1/36 yo girl) here today for a prophylactic mood check. She has a h/o GAD, depression but is well controlled with her meds. She had a few days when she first went home after her delivery where her mood was " all over the place" but doing well now.   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 She is planning to use FMLA to stay home until "early August". No sex yet. Breastfeeding is going well.    Objective:   Physical Exam  [id number] Breathing, conversing, and ambulating normally She denies any mood problems or any problems in general      Assessment & Plan:  H/o well controlled mood disorders-  F/u for PP visit/prn sooner

## 2015-12-28 MED FILL — NORETHINDRONE 0.35 MG TAB: 0.35 | 84 days supply | Qty: 84 | Fill #0

## 2016-01-01 ENCOUNTER — Encounter: Payer: Self-pay | Admitting: Osteopathic Medicine

## 2016-01-01 ENCOUNTER — Ambulatory Visit (INDEPENDENT_AMBULATORY_CARE_PROVIDER_SITE_OTHER): Payer: 59 | Admitting: Osteopathic Medicine

## 2016-01-01 VITALS — BP 92/70 | HR 84 | Ht 63.0 in | Wt 165.0 lb

## 2016-01-01 DIAGNOSIS — W57XXXA Bitten or stung by nonvenomous insect and other nonvenomous arthropods, initial encounter: Secondary | ICD-10-CM

## 2016-01-01 DIAGNOSIS — T148 Other injury of unspecified body region: Secondary | ICD-10-CM

## 2016-01-01 NOTE — Patient Instructions (Signed)
Tick Bite Information Ticks are insects that attach themselves to the skin and draw blood for food. There are various types of ticks. Common types include wood ticks and deer ticks. Most ticks live in shrubs and grassy areas. Ticks can climb onto your body when you make contact with leaves or grass where the tick is waiting. The most common places on the body for ticks to attach themselves are the scalp, neck, armpits, waist, and groin. Most tick bites are harmless, but sometimes ticks carry germs that cause diseases. These germs can be spread to a person during the tick's feeding process. The chance of a disease spreading through a tick bite depends on:   The type of tick.  Time of year.   How long the tick is attached.   Geographic location.  HOW CAN YOU PREVENT TICK BITES? Take these steps to help prevent tick bites when you are outdoors:  Wear protective clothing. Long sleeves and long pants are best.   Wear white clothes so you can see ticks more easily.  Tuck your pant legs into your socks.   If walking on a trail, stay in the middle of the trail to avoid brushing against bushes.  Avoid walking through areas with long grass.  Put insect repellent on all exposed skin and along boot tops, pant legs, and sleeve cuffs.   Check clothing, hair, and skin repeatedly and before going inside.   Brush off any ticks that are not attached.  Take a shower or bath as soon as possible after being outdoors.  WHAT IS THE PROPER WAY TO REMOVE A TICK? Ticks should be removed as soon as possible to help prevent diseases caused by tick bites. 1. If latex gloves are available, put them on before trying to remove a tick.  2. Using fine-point tweezers, grasp the tick as close to the skin as possible. You may also use curved forceps or a tick removal tool. Grasp the tick as close to its head as possible. Avoid grasping the tick on its body. 3. Pull gently with steady upward pressure until  the tick lets go. Do not twist the tick or jerk it suddenly. This may break off the tick's head or mouth parts. 4. Do not squeeze or crush the tick's body. This could force disease-carrying fluids from the tick into your body.  5. After the tick is removed, wash the bite area and your hands with soap and water or other disinfectant such as alcohol. 6. Apply a small amount of antiseptic cream or ointment to the bite site.  7. Wash and disinfect any instruments that were used.  Do not try to remove a tick by applying a hot match, petroleum jelly, or fingernail polish to the tick. These methods do not work and may increase the chances of disease being spread from the tick bite.  WHEN SHOULD YOU SEEK MEDICAL CARE? Contact your health care provider if you are unable to remove a tick from your skin or if a part of the tick breaks off and is stuck in the skin.  After a tick bite, you need to be aware of signs and symptoms that could be related to diseases spread by ticks. Contact your health care provider if you develop any of the following in the days or weeks after the tick bite:  Unexplained fever.  Rash. A circular rash that appears days or weeks after the tick bite may indicate the possibility of Lyme disease. The rash may resemble   a target with a bull's-eye and may occur at a different part of your body than the tick bite.  Redness and swelling in the area of the tick bite.   Tender, swollen lymph glands.   Diarrhea.   Weight loss.   Cough.   Fatigue.   Muscle, joint, or bone pain.   Abdominal pain.   Headache.   Lethargy or a change in your level of consciousness.  Difficulty walking or moving your legs.   Numbness in the legs.   Paralysis.  Shortness of breath.   Confusion.   Repeated vomiting.    This information is not intended to replace advice given to you by your health care provider. Make sure you discuss any questions you have with your health  care provider.   Document Released: 08/01/2000 Document Revised: 08/25/2014 Document Reviewed: 01/12/2013 Elsevier Interactive Patient Education 2016 Elsevier Inc.  

## 2016-01-01 NOTE — Progress Notes (Signed)
HPI: Adriana Frazier is a 35 y.o. female who presents to [facility] today for chief complaint of:  Chief Complaint  Patient presents with  . Insect Bite    Tick bite under right breast     . Location: under R breast  . Timing: thinks tick may have attached maybe no more than 24 hours before pulling it off, pulled off 4 days ago - husband put rubbing alcohol on and removed it worth tweezers  . Modifying factors: breastfeeding . Assoc signs/symptoms: no fever/chills  TICK BITE: Antibiotic prophylaxis should be used only in patients who meet ALL of the following criteria:  Attached tick identified as an adult or nymphal Ixodes scapularis tick (deer tick) - unable to confirm Tick is estimated to have been attached for ?36 hours (by degree of engorgement or time of exposure) - unlikely - pt states 24 hours or less Prophylaxis is begun within 72 hours of tick removal - presented to clinic >72 hours post bite/removal Local rate of infection of ticks with B. burgdorferi is ?20 percent (these rates of infection have been shown to occur in parts of [place], parts of the [address], and parts of [place] and [place]) - Yes  Doxycycline is not contraindicated (ie, the patient is not <70 years of age, pregnant, or lactating) - pt lactating (UpToDate 11/2015)    Past medical, social and family history reviewed: Past Medical History  Diagnosis Date  . Ulcerative colitis (HCC)   . Anxiety   . Depression    Past Surgical History  Procedure Laterality Date  . Cyst in neck injected with radiation    . Colonoscopy    . Sigmoidoscopy     Social History  Substance Use Topics  . Smoking status: Never Smoker   . Smokeless tobacco: Never Used  . Alcohol Use: 0.0 oz/week    0 Standard drinks or equivalent per week     Comment: rarely.    Family History  Problem Relation Age of Onset  . Rheum arthritis Mother   . Depression Mother   .  Hyperlipidemia Father   . Bipolar disorder Father   . Bipolar disorder Sister   . Bipolar disorder Paternal Uncle   . Colon cancer      grandparents.     Current Outpatient Prescriptions  Medication Sig Dispense Refill  . buPROPion (WELLBUTRIN SR) 150 MG 12 hr tablet Take 1 tablet (150 mg total) by mouth daily. 90 tablet 4  . Cholecalciferol (VITAMIN D-3) 1000 UNITS CAPS Take 1,000 Units by mouth daily.     [name] FLUoxetine (PROZAC) 40 MG capsule Take 1 capsule (40 mg total) by mouth daily. 90 capsule 4  . mesalamine (LIALDA) 1.2 G EC tablet Take 2.4 g by mouth daily with breakfast. 2 tabs    . norethindrone (ORTHO MICRONOR) 0.35 MG tablet Take 1 tablet (0.35 mg total) by mouth daily. 1 Package 11  . Omega 3-6-9 Fatty Acids (OMEGA 3-6-9 PO) Take 1,600 mg by mouth daily.    . Prenatal Vit-Fe Fumarate-FA (PRENATAL MULTIVITAMIN) TABS tablet Take 1 tablet by mouth daily at 12 noon.     No current facility-administered medications for this visit.   Allergies  Allergen Reactions  . Shellfish Allergy Nausea And Vomiting      Review of Systems: CONSTITUTIONAL:  No  fever, no chills HEAD/EYES/EARS/NOSE/THROAT: No  headache, no vision change, CARDIAC: No  chest pain,  MUSCULOSKELETAL: No  myalgia/arthralgia SKIN: No  rash/wounds/concerning lesions  other than spot under R breast where tick was removed HEM/ONC: No  easy bruising/bleeding, No  abnormal lymph node ENDOCRINE: No polyuria/polydipsia/polyphagia, No  heat/cold intolerance  NEUROLOGIC: No  weakness, No  dizziness, No  slurred speech PSYCHIATRIC: No  concerns with depression, No  concerns with anxiety, No sleep problems  Exam:  BP 92/70 mmHg  Pulse 84  Ht 5\' 3"  (1.6 m)  Wt 165 lb (74.844 kg)  BMI 29.24 kg/m2 Constitutional: VS see above. General Appearance: alert, well-developed, well-nourished, NAD Eyes: Normal lids and conjunctive, non-icteric sclera,  Ears, Nose, Mouth, Throat: MMM, Normal external inspection  ears/nares/mouth/lips/gums,  Neck: No masses, trachea midline.  Respiratory: Normal respiratory effort.  Skin: warm, dry, intact. Positive erythematous patch, slightly raised/edematous, just under right breast, irregular borders, 2.5 cm x 1.5 cm approximately. No tick remains visible. No concerning nevi or subq nodules on limited exam.   Psychiatric: Normal judgment/insight. Normal mood and affect. Oriented x3.      ASSESSMENT/PLAN: Tick not attached long enough to have major concern for transmission of Lyme or other tickborne illness, no erythema migrans, patient is currently breast-feeding. Unlikely Lyme disease or other tickborne illness, however precautions were reviewed with regard to erythema migrans and other rashes, fevers, joint pain or other concerns.  Tick bite - Patient does not meet criteria for prophylaxis, no erythema migrans, precautions were reviewed.     All questions were answered. Visit summary with updated medication list and pertinent instructions was printed for patient. ER/RTC precautions were reviewed with the patient. Return if symptoms worsen or fail to improve, if you notice target-shaped rash, or any other concerns.

## 2016-01-08 MED FILL — LIALDA 1.2 GM TABLET SA: 1.2 | 60 days supply | Qty: 120 | Fill #4

## 2016-01-22 ENCOUNTER — Encounter: Payer: Self-pay | Admitting: Obstetrics & Gynecology

## 2016-01-22 ENCOUNTER — Ambulatory Visit (INDEPENDENT_AMBULATORY_CARE_PROVIDER_SITE_OTHER): Payer: 59 | Admitting: Obstetrics & Gynecology

## 2016-01-22 DIAGNOSIS — Z30011 Encounter for initial prescription of contraceptive pills: Secondary | ICD-10-CM

## 2016-01-22 NOTE — Progress Notes (Signed)
  Subjective:     Adriana PeaSarah Frazier is a 35 y.o. female who presents for a postpartum visit. She is 6 weeks postpartum following a spontaneous vaginal delivery. I have fully reviewed the prenatal and intrapartum course. The delivery was at 35+ gestational weeks. Outcome: spontaneous vaginal delivery. Anesthesia: local. Postpartum course has been normal. Baby's course has been normal. Baby is feeding by breast. Bleeding no bleeding. Bowel function is normal. Bladder function is normal. Patient is sexually active. Contraception method is oral progesterone-only contraceptive. Postpartum depression screening: negative. She is treated by Weyman PedroJade Brebeck for depression. She declines to make an appt at this time. She says that she is not depressed at this time.  The following portions of the patient's history were reviewed and updated as appropriate: allergies, current medications, past family history, past medical history, past social history, past surgical history and problem list.  Review of Systems Pertinent items are noted in HPI.   Objective:    Wt 165 lb (74.844 kg)  General:  alert   Breasts:  inspection negative, no nipple discharge or bleeding, no masses or nodularity palpable  Lungs: clear to auscultation bilaterally  Heart:  regular rate and rhythm, S1, S2 normal, no murmur, click, rub or gallop  Abdomen: soft, non-tender; bowel sounds normal; no masses,  no organomegaly   Vulva:  normal  Vagina: normal vagina  Cervix:  anteverted  Corpus: normal  Adnexa:  normal adnexa  Rectal Exam: Not performed.        Assessment:     Normal  postpartum exam. Pap smear not done at today's visit.   Plan:    1. Contraception: oral progesterone-only contraceptive 2. She will follow up with Weyman PedroJade Brebeck prn 3. Follow up in: 1 year or as needed.

## 2016-02-05 ENCOUNTER — Ambulatory Visit: Payer: 59 | Admitting: Physician Assistant

## 2016-02-05 MED FILL — FLUoxetine HCL 40 MG CAPS: 40 | 90 days supply | Qty: 90 | Fill #2

## 2016-02-05 MED FILL — BUPROPION HCL SR 150 MG TAB: 150 | 90 days supply | Qty: 90 | Fill #2

## 2016-02-27 ENCOUNTER — Encounter: Payer: Self-pay | Admitting: *Deleted

## 2016-03-18 DIAGNOSIS — K5190 Ulcerative colitis, unspecified, without complications: Secondary | ICD-10-CM | POA: Diagnosis not present

## 2016-03-18 DIAGNOSIS — Z79899 Other long term (current) drug therapy: Secondary | ICD-10-CM | POA: Diagnosis not present

## 2016-03-18 MED FILL — MESALAMINE DR 1.2 GM TABLET: 1.2 | 90 days supply | Qty: 180 | Fill #0

## 2016-04-04 MED FILL — HEATHER TABLET: 0.35 | 84 days supply | Qty: 84 | Fill #1

## 2016-05-07 MED FILL — FLUoxetine HCL 40 MG CAPS: 40 | 90 days supply | Qty: 90 | Fill #3

## 2016-05-07 MED FILL — BUPROPION HCL SR 150 MG TAB: 150 | 90 days supply | Qty: 90 | Fill #3

## 2016-06-18 MED FILL — MESALAMINE DR 1.2 GM TABLET: 1.2 | 90 days supply | Qty: 180 | Fill #1

## 2016-06-20 ENCOUNTER — Encounter: Payer: Self-pay | Admitting: Physician Assistant

## 2016-06-20 ENCOUNTER — Ambulatory Visit (INDEPENDENT_AMBULATORY_CARE_PROVIDER_SITE_OTHER): Payer: 59 | Admitting: Physician Assistant

## 2016-06-20 VITALS — BP 96/62 | HR 75 | Ht 65.0 in | Wt 158.0 lb

## 2016-06-20 DIAGNOSIS — Z0000 Encounter for general adult medical examination without abnormal findings: Secondary | ICD-10-CM | POA: Diagnosis not present

## 2016-06-20 DIAGNOSIS — E7800 Pure hypercholesterolemia, unspecified: Secondary | ICD-10-CM

## 2016-06-20 DIAGNOSIS — Z131 Encounter for screening for diabetes mellitus: Secondary | ICD-10-CM | POA: Diagnosis not present

## 2016-06-20 NOTE — Patient Instructions (Signed)

## 2016-06-21 LAB — COMPLETE METABOLIC PANEL WITH GFR
ALBUMIN: 4.1 g/dL (ref 3.6–5.1)
ALK PHOS: 61 U/L (ref 33–115)
ALT: 14 U/L (ref 6–29)
AST: 15 U/L (ref 10–30)
BUN: 15 mg/dL (ref 7–25)
CALCIUM: 9.4 mg/dL (ref 8.6–10.2)
CO2: 29 mmol/L (ref 20–31)
CREATININE: 0.81 mg/dL (ref 0.50–1.10)
Chloride: 104 mmol/L (ref 98–110)
GFR, Est African American: >89 mL/min (ref >=60)
GFR, Est Non African American: >89 mL/min (ref >=60)
GLUCOSE: 88 mg/dL (ref 65–99)
Potassium: 3.8 mmol/L (ref 3.5–5.3)
SODIUM: 142 mmol/L (ref 135–146)
TOTAL PROTEIN: 6.8 g/dL (ref 6.1–8.1)
Total Bilirubin: 0.5 mg/dL (ref 0.2–1.2)

## 2016-06-21 LAB — TSH: TSH: 0.87 m[IU]/L

## 2016-06-21 LAB — LIPID PANEL
CHOLESTEROL: 239 mg/dL — AB (ref 125–200)
HDL: 49 mg/dL (ref >=46)
LDL Cholesterol: 162 mg/dL — ABNORMAL HIGH (ref <130)
Total CHOL/HDL Ratio: 4.9 Ratio (ref <=5.0)
Triglycerides: 139 mg/dL (ref <150)
VLDL: 28 mg/dL (ref <30)

## 2016-06-22 NOTE — Progress Notes (Signed)
Subjective:     Dominic PeaSarah Schrom is a [age] y.o. female and is here for a comprehensive physical exam. The patient reports no problems.  Social History   Social History  . Marital status: Married    Spouse name: N/A  . Number of children: 1   . Years of education: N/A   Occupational History  . therapist     Social History Main Topics  . Smoking status: Never Smoker  . Smokeless tobacco: Never Used  . Alcohol use 0.0 oz/week     Comment: rarely.   . Drug use: No  . Sexual activity: Yes    Birth control/ protection: None, Pill   Other Topics Concern  . Not on file   Social History Narrative   Works out about 5 days per week.  No regular caffeine.    Health Maintenance  Topic Date Due  . PAP SMEAR  02/06/2018  . TETANUS/TDAP  10/14/2025  . INFLUENZA VACCINE  Completed  . HIV Screening  Completed    The following portions of the patient's history were reviewed and updated as appropriate: allergies, current medications, past family history, past medical history, past social history, past surgical history and problem list.  Review of Systems A comprehensive review of systems was negative.   Objective:    BP 96/62   Pulse 75   Ht 5\' 5"  (1.651 m)   Wt 158 lb (71.7 kg)   BMI 26.29 kg/m  General appearance: alert, cooperative and appears stated age Head: Normocephalic, without obvious abnormality, atraumatic Eyes: conjunctivae/corneas clear. PERRL, EOM's intact. Fundi benign. Ears: normal TM's and external ear canals both ears Nose: Nares normal. Septum midline. Mucosa normal. No drainage or sinus tenderness. Throat: lips, mucosa, and tongue normal; teeth and gums normal Neck: no adenopathy, no carotid bruit, no JVD, supple, symmetrical, trachea midline and thyroid not enlarged, symmetric, no tenderness/mass/nodules Back: symmetric, no curvature. ROM normal. No CVA tenderness. Lungs: clear to auscultation bilaterally Heart: regular rate and rhythm, S1, S2 normal, no  murmur, click, rub or gallop Abdomen: soft, non-tender; bowel sounds normal; no masses,  no organomegaly Extremities: extremities normal, atraumatic, no cyanosis or edema Pulses: 2+ and symmetric Skin: Skin color, texture, turgor normal. No rashes or lesions Lymph nodes: Cervical, supraclavicular, and axillary nodes normal. Neurologic: Alert and oriented X 3, normal strength and tone. Normal symmetric reflexes. Normal coordination and gait    Assessment:    Healthy female exam.      Plan:  [name].[name].[name] was seen today for annual exam.  Diagnoses and all orders for this visit:  Routine physical examination -     Lipid panel -     COMPLETE METABOLIC PANEL WITH GFR -     TSH  Elevated LDL cholesterol level -     Lipid panel  Screening for diabetes mellitus -     COMPLETE METABOLIC PANEL WITH GFR   Discussed vitamin D 800 units daily and calcium 1500mg  daily.  Encouraged 150 minutes of exercise a week.     See After Visit Summary for Counseling Recommendations

## 2016-06-23 ENCOUNTER — Encounter: Payer: Self-pay | Admitting: Physician Assistant

## 2016-06-23 DIAGNOSIS — E785 Hyperlipidemia, unspecified: Secondary | ICD-10-CM | POA: Insufficient documentation

## 2016-06-24 ENCOUNTER — Encounter: Payer: Self-pay | Admitting: Physician Assistant

## 2016-06-24 ENCOUNTER — Other Ambulatory Visit: Payer: Self-pay | Admitting: Physician Assistant

## 2016-06-24 MED ORDER — LEVONORGEST-ETH ESTRAD 91-DAY 0.15-0.03 MG PO TABS: 1.0000 | ORAL_TABLET | Freq: Every day | ORAL | 4 refills | Status: DC

## 2016-06-24 MED FILL — INTROVALE 0.15-0.03 MG TAB: 0.15-0.03 | 90 days supply | Qty: 91 | Fill #0

## 2016-08-06 ENCOUNTER — Other Ambulatory Visit: Payer: Self-pay | Admitting: Physician Assistant

## 2016-08-06 MED FILL — BUPROPION SR 150 MG TABLET: 150 | 90 days supply | Qty: 90 | Fill #0

## 2016-08-06 MED FILL — FLUoxetine HCL 40 MG CAPS: 40 | 90 days supply | Qty: 90 | Fill #0

## 2016-08-19 ENCOUNTER — Encounter: Payer: Self-pay | Admitting: Physician Assistant

## 2016-08-25 MED FILL — POLYETHYLENE GLYCOL 3350: 2 days supply | Qty: 255 | Fill #0

## 2016-08-28 ENCOUNTER — Encounter: Payer: Self-pay | Admitting: Physician Assistant

## 2016-08-28 DIAGNOSIS — K5190 Ulcerative colitis, unspecified, without complications: Secondary | ICD-10-CM | POA: Diagnosis not present

## 2016-08-28 DIAGNOSIS — K633 Ulcer of intestine: Secondary | ICD-10-CM | POA: Diagnosis not present

## 2016-08-29 ENCOUNTER — Encounter: Payer: Self-pay | Admitting: Physician Assistant

## 2016-08-29 DIAGNOSIS — K5080 Crohn's disease of both small and large intestine without complications: Secondary | ICD-10-CM | POA: Insufficient documentation

## 2016-09-16 ENCOUNTER — Other Ambulatory Visit: Payer: Self-pay | Admitting: Physician Assistant

## 2016-09-16 ENCOUNTER — Encounter: Payer: Self-pay | Admitting: Physician Assistant

## 2016-09-16 MED ORDER — NORETHIN ACE-ETH ESTRAD-FE 1.5-30 MG-MCG PO TABS: 1.0000 | ORAL_TABLET | Freq: Every day | ORAL | 11 refills | Status: DC

## 2016-09-16 MED FILL — LARIN FE 1.5-30 TABLET: 1.5-30 | 84 days supply | Qty: 84 | Fill #0

## 2016-09-16 MED FILL — LIALDA 1.2 GM TABLET SA: 1.2 | 90 days supply | Qty: 180 | Fill #2

## 2016-09-30 ENCOUNTER — Encounter: Payer: Self-pay | Admitting: Physician Assistant

## 2016-09-30 ENCOUNTER — Ambulatory Visit (INDEPENDENT_AMBULATORY_CARE_PROVIDER_SITE_OTHER): Payer: 59 | Admitting: Physician Assistant

## 2016-09-30 VITALS — BP 111/77 | HR 92 | Temp 99.0°F | Ht 63.25 in | Wt 161.2 lb

## 2016-09-30 DIAGNOSIS — B9789 Other viral agents as the cause of diseases classified elsewhere: Secondary | ICD-10-CM

## 2016-09-30 DIAGNOSIS — R059 Cough, unspecified: Secondary | ICD-10-CM

## 2016-09-30 DIAGNOSIS — J069 Acute upper respiratory infection, unspecified: Secondary | ICD-10-CM

## 2016-09-30 DIAGNOSIS — R05 Cough: Secondary | ICD-10-CM | POA: Diagnosis not present

## 2016-09-30 DIAGNOSIS — R52 Pain, unspecified: Secondary | ICD-10-CM

## 2016-09-30 LAB — POCT INFLUENZA A/B
INFLUENZA B, POC: NEGATIVE
Influenza A, POC: NEGATIVE

## 2016-09-30 NOTE — Progress Notes (Signed)
   Subjective:    Patient ID: Adriana Frazier, female    DOB: 1980/11/18, 36 y.o.   MRN: [id number]  HPI Pt is a 36 yo female who presents to the clinic with fatigue, ST, headache, body aches low grade fever since yesterday. Husband and son have been sick with similar symptoms. She wants to confirm she does not have the flu. Her cough is dry and burns some. No wheezing or SOB. She is having some ear popping.    Review of Systems    see HPI>  Objective:   Physical Exam  Constitutional: She is oriented to person, place, and time. She appears well-developed and well-nourished.  HENT:  Head: Normocephalic and atraumatic.  Right Ear: External ear normal.  Left Ear: External ear normal.  TM"s clear bilaterally.  Nasal turbinates red and swollen.  Tenderness over maxillary sinuses to palpation.  orpharynx erythematous without any exudate.   Eyes: Conjunctivae are normal. Right eye exhibits no discharge. Left eye exhibits no discharge.  Neck: Normal range of motion. Neck supple.  Cardiovascular: Normal rate, regular rhythm and normal heart sounds.   Pulmonary/Chest: Effort normal and breath sounds normal. She has no wheezes.  Lymphadenopathy:    She has cervical adenopathy.  Neurological: She is alert and oriented to person, place, and time.  Skin:  Flushed cheeks.   Psychiatric: She has a normal mood and affect. Her behavior is normal.          Assessment & Plan:  [name].[name].Adriana Frazier was seen today for cough and sore throat.  Diagnoses and all orders for this visit:  Viral URI  Cough -     POCT Influenza A/B  Body aches -     POCT Influenza A/B   Flu test negative. Discussed likely viral. Encouraged tylenol cold sinus severe, gargle with salt water, flonase nasal spray, ibuprofen for body aches, rest, hydrate, delsym/cough drops. Written out of work for today and tomorrow. HO given. Follow up if symptoms change or worsen.

## 2016-09-30 NOTE — Patient Instructions (Signed)
Tylenol cold sinus severe with ibuprofen. Gargle with salt water.     Upper Respiratory Infection, Adult Most upper respiratory infections (URIs) are caused by a virus. A URI affects the nose, throat, and upper air passages. The most common type of URI is often called "the common cold." Follow these instructions at home:  Take medicines only as told by your doctor.  Gargle warm saltwater or take cough drops to comfort your throat as told by your doctor.  Use a warm mist humidifier or inhale steam from a shower to increase air moisture. This may make it easier to breathe.  Drink enough fluid to keep your pee (urine) clear or pale yellow.  Eat soups and other clear broths.  Have a healthy diet.  Rest as needed.  Go back to work when your fever is gone or your doctor says it is okay.  You may need to stay home longer to avoid giving your URI to others.  You can also wear a face mask and wash your hands often to prevent spread of the virus.  Use your inhaler more if you have asthma.  Do not use any tobacco products, including cigarettes, chewing tobacco, or electronic cigarettes. If you need help quitting, ask your doctor. Contact a doctor if:  You are getting worse, not better.  Your symptoms are not helped by medicine.  You have chills.  You are getting more short of breath.  You have brown or red mucus.  You have yellow or brown discharge from your nose.  You have pain in your face, especially when you bend forward.  You have a fever.  You have puffy (swollen) neck glands.  You have pain while swallowing.  You have white areas in the back of your throat. Get help right away if:  You have very bad or constant:  Headache.  Ear pain.  Pain in your forehead, behind your eyes, and over your cheekbones (sinus pain).  Chest pain.  You have long-lasting (chronic) lung disease and any of the following:  Wheezing.  Long-lasting cough.  Coughing up  blood.  A change in your usual mucus.  You have a stiff neck.  You have changes in your:  Vision.  Hearing.  Thinking.  Mood. This information is not intended to replace advice given to you by your health care provider. Make sure you discuss any questions you have with your health care provider. Document Released: 01/21/2008 Document Revised: 04/06/2016 Document Reviewed: 11/09/2013 Elsevier Interactive Patient Education  2017 [employer]

## 2016-10-06 ENCOUNTER — Encounter: Payer: Self-pay | Admitting: Physician Assistant

## 2016-10-07 ENCOUNTER — Ambulatory Visit (INDEPENDENT_AMBULATORY_CARE_PROVIDER_SITE_OTHER): Payer: 59 | Admitting: Physician Assistant

## 2016-10-07 ENCOUNTER — Encounter: Payer: Self-pay | Admitting: Physician Assistant

## 2016-10-07 VITALS — BP 106/72 | HR 83 | Temp 98.2°F | Ht 63.25 in | Wt 155.0 lb

## 2016-10-07 DIAGNOSIS — M542 Cervicalgia: Secondary | ICD-10-CM | POA: Diagnosis not present

## 2016-10-07 DIAGNOSIS — R110 Nausea: Secondary | ICD-10-CM | POA: Diagnosis not present

## 2016-10-07 DIAGNOSIS — R519 Headache, unspecified: Secondary | ICD-10-CM

## 2016-10-07 DIAGNOSIS — R51 Headache: Secondary | ICD-10-CM

## 2016-10-07 MED ORDER — ONDANSETRON HCL 4 MG PO TABS: 4.0000 mg | ORAL_TABLET | Freq: Three times a day (TID) | ORAL | 0 refills | Status: DC | PRN

## 2016-10-07 MED ORDER — CYCLOBENZAPRINE HCL 10 MG PO TABS: 10.0000 mg | ORAL_TABLET | Freq: Three times a day (TID) | ORAL | 0 refills | Status: DC | PRN

## 2016-10-07 MED ORDER — KETOROLAC TROMETHAMINE 60 MG/2ML IM SOLN
60.0000 mg | Freq: Once | INTRAMUSCULAR | Status: AC
Start: 2016-10-07 — End: 2016-10-07
  Administered 2016-10-07: 60 mg via INTRAMUSCULAR

## 2016-10-07 MED FILL — CYCLOBENZAPRINE 10 MG TAB: 10 | 10 days supply | Qty: 30 | Fill #0

## 2016-10-07 MED FILL — ONDANSETRON HCL 4 MG TABLET: 4 | 7 days supply | Qty: 20 | Fill #0

## 2016-10-07 NOTE — Patient Instructions (Signed)
biofreeze massage

## 2016-10-07 NOTE — Progress Notes (Addendum)
Subjective:     Patient ID: Adriana PeaSarah Azeez, female   DOB: 09/24/1980, 36 y.o.   MRN: [id number]  HPI  This is a 36 year old female who presents for evaluation of headaches and body aches. Patient was seen last week and was tested for the flu which was negative. She continues to endorse some cough, but states she now has a headache, body ache, and nausea. Denies self-reported fever, diarrhea, abdominal pain. Reports one episode of self-induced vomiting. States the headache is located in the back of her head and neck and goes into her shoulders. Denies numbness, tingling, visual changes. States she has been under increased stress recently given a sick child and missed work. 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   Review of Systems All other ROS negative except those noted in the HPI.    Objective:   Physical Exam  Constitutional: She is oriented to person, place, and time. She appears well-developed and well-nourished.  HENT:  Head: Normocephalic and atraumatic.  Right Ear: External ear normal.  Left Ear: External ear normal.  Nose: Right sinus exhibits no maxillary sinus tenderness and no frontal sinus tenderness. Left sinus exhibits no maxillary sinus tenderness and no frontal sinus tenderness.  Mouth/Throat: No oropharyngeal exudate.  Neck: Normal range of motion. Muscular tenderness present. No spinous process tenderness present. No neck rigidity. No tracheal deviation present. No thyromegaly present.  Cardiovascular: Normal rate and regular rhythm.  Exam reveals no gallop and no friction rub.   No murmur heard. Pulmonary/Chest: Effort normal and breath sounds normal. She has no wheezes. She has no rales.  Abdominal: Soft. Bowel sounds are normal.  Musculoskeletal: She exhibits tenderness.  Lymphadenopathy:    She has no cervical adenopathy.  Neurological: She is alert and oriented to person, place, and time.  Skin: Skin is warm and dry. No rash  noted. No erythema.  Psychiatric: Her mood appears anxious.      Assessment/Plan:  Diagnoses and all orders for this visit:  Persistent headaches -     cyclobenzaprine (FLEXERIL) 10 MG tablet; Take 1 tablet (10 mg total) by mouth 3 (three) times daily as needed for muscle spasms. -     ketorolac (TORADOL) injection 60 mg; Inject 2 mLs (60 mg total) into the muscle once.  Neck pain -     cyclobenzaprine (FLEXERIL) 10 MG tablet; Take 1 tablet (10 mg total) by mouth 3 (three) times daily as needed for muscle spasms. -     ketorolac (TORADOL) injection 60 mg; Inject 2 mLs (60 mg total) into the muscle once.  Nausea -     ondansetron (ZOFRAN) 4 MG tablet; Take 1 tablet (4 mg total) by mouth every 8 (eight) hours as needed for nausea or vomiting.  Given tenderness over paraspinal neck muscles and history of increased stress, pain most likely related to muscle spasms of the neck which is contributing to persistent headache and may be causing some associated nausea. Prescribed Flexeril to help with muscle spasms and instructed patient this may cause some drowsiness and to avoid driving. Also administered shot of Toradol in office to help with head and neck pain. Sent home with Zofran PRN for nausea. Encouraged patient to call or come back in if headache persists or worsens, if she notices any vision changes, becomes feverish, or begins to experience numbness radiating down her arms. Going to start FMLA paperwork for patient.

## 2016-10-10 ENCOUNTER — Encounter: Payer: Self-pay | Admitting: Physician Assistant

## 2016-10-10 DIAGNOSIS — K5180 Other ulcerative colitis without complications: Secondary | ICD-10-CM | POA: Diagnosis not present

## 2016-10-15 ENCOUNTER — Other Ambulatory Visit: Payer: Self-pay | Admitting: Physician Assistant

## 2016-10-15 MED ORDER — FLUOXETINE HCL 20 MG PO CAPS: 20.0000 mg | ORAL_CAPSULE | Freq: Every day | ORAL | 1 refills | Status: DC

## 2016-10-22 ENCOUNTER — Ambulatory Visit (INDEPENDENT_AMBULATORY_CARE_PROVIDER_SITE_OTHER): Payer: 59 | Admitting: Family Medicine

## 2016-10-22 ENCOUNTER — Encounter: Payer: Self-pay | Admitting: Family Medicine

## 2016-10-22 VITALS — BP 110/67 | HR 79 | Wt 155.0 lb

## 2016-10-22 DIAGNOSIS — J069 Acute upper respiratory infection, unspecified: Secondary | ICD-10-CM

## 2016-10-22 MED ORDER — PREDNISONE 10 MG PO TABS: 30.0000 mg | ORAL_TABLET | Freq: Every day | ORAL | 0 refills | Status: DC

## 2016-10-22 MED ORDER — AZITHROMYCIN 250 MG PO TABS: 250.0000 mg | ORAL_TABLET | Freq: Every day | ORAL | 0 refills | Status: DC

## 2016-10-22 MED ORDER — IPRATROPIUM BROMIDE 0.06 % NA SOLN: 2.0000 | NASAL | 6 refills | Status: DC | PRN

## 2016-10-22 MED FILL — IPRATROPIUM 0.06% SPRAY: 0.06 | 30 days supply | Qty: 15 | Fill #0

## 2016-10-22 NOTE — Patient Instructions (Signed)
Thank you for coming in today. Continue over the counter medicines.  Add zyrtec (certizine) Daily.  Use the nasal spray.  Take prednisone and azithromycin if worse or not better.  Call or go to the emergency room if you get worse, have trouble breathing, have chest pains, or palpitations.    Upper Respiratory Infection, Adult Most upper respiratory infections (URIs) are caused by a virus. A URI affects the nose, throat, and upper air passages. The most common type of URI is often called "the common cold." Follow these instructions at home:  Take medicines only as told by your doctor.  Gargle warm saltwater or take cough drops to comfort your throat as told by your doctor.  Use a warm mist humidifier or inhale steam from a shower to increase air moisture. This may make it easier to breathe.  Drink enough fluid to keep your pee (urine) clear or pale yellow.  Eat soups and other clear broths.  Have a healthy diet.  Rest as needed.  Go back to work when your fever is gone or your doctor says it is okay.  You may need to stay home longer to avoid giving your URI to others.  You can also wear a face mask and wash your hands often to prevent spread of the virus.  Use your inhaler more if you have asthma.  Do not use any tobacco products, including cigarettes, chewing tobacco, or electronic cigarettes. If you need help quitting, ask your doctor. Contact a doctor if:  You are getting worse, not better.  Your symptoms are not helped by medicine.  You have chills.  You are getting more short of breath.  You have brown or red mucus.  You have yellow or brown discharge from your nose.  You have pain in your face, especially when you bend forward.  You have a fever.  You have puffy (swollen) neck glands.  You have pain while swallowing.  You have white areas in the back of your throat. Get help right away if:  You have very bad or constant:  Headache.  Ear  pain.  Pain in your forehead, behind your eyes, and over your cheekbones (sinus pain).  Chest pain.  You have long-lasting (chronic) lung disease and any of the following:  Wheezing.  Long-lasting cough.  Coughing up blood.  A change in your usual mucus.  You have a stiff neck.  You have changes in your:  Vision.  Hearing.  Thinking.  Mood. This information is not intended to replace advice given to you by your health care provider. Make sure you discuss any questions you have with your health care provider. Document Released: 01/21/2008 Document Revised: 04/06/2016 Document Reviewed: 11/09/2013 Elsevier Interactive Patient Education  2017 [employer]

## 2016-10-22 NOTE — Progress Notes (Signed)
Adriana Frazier is a 36 y.o. female who presents to [facility] today for cough congestion and runny nose fatigue. Symptoms present for days. No vomiting diarrhea chest pain or palpitations. She has tried some over-the-counter medications which have helped some. She is not currently breast-feeding. She has a 71-year-old an 27-month-old at home. The children attend daycare. She does not recall any known sick contacts. She denies itchy watery eyes or sneezing.   Past Medical History:  Diagnosis Date  . Anxiety   . Depression   . Ulcerative colitis [facility])    Past Surgical History:  Procedure Laterality Date  . COLONOSCOPY    . cyst in neck injected with radiation    . SIGMOIDOSCOPY     Social History  Substance Use Topics  . Smoking status: Never Smoker  . Smokeless tobacco: Never Used  . Alcohol use 0.0 oz/week     Comment: rarely.    family history includes Bipolar disorder in her father, paternal uncle, and sister; Depression in her mother; Hyperlipidemia in her father; Rheum arthritis in her mother.  ROS as above:  Medications: Current Outpatient Prescriptions  Medication Sig Dispense Refill  . buPROPion (WELLBUTRIN SR) 150 MG 12 hr tablet TAKE 1 TABLET BY MOUTH DAILY 90 tablet 4  . Cholecalciferol (VITAMIN D-3) 1000 UNITS CAPS Take 1,000 Units by mouth daily.     . cyclobenzaprine (FLEXERIL) 10 MG tablet Take 1 tablet (10 mg total) by mouth 3 (three) times daily as needed for muscle spasms. 30 tablet 0  . FLUoxetine (PROZAC) 40 MG capsule TAKE ONE CAPSULE BY MOUTH DAILY 90 capsule 4  . mesalamine (LIALDA) 1.2 G EC tablet Take 2.4 g by mouth daily with breakfast. 2 tabs    . norethindrone-ethinyl estradiol-iron (MICROGESTIN FE,GILDESS FE,LOESTRIN FE) 1.5-30 MG-MCG tablet Take 1 tablet by mouth daily. 1 Package 11  . Omega 3-6-9 Fatty Acids (OMEGA 3-6-9 PO) Take  1,600 mg by mouth daily.    . ondansetron (ZOFRAN) 4 MG tablet Take 1 tablet (4 mg total) by mouth every 8 (eight) hours as needed for nausea or vomiting. 20 tablet 0  . Prenatal Vit-Fe Fumarate-FA (PRENATAL MULTIVITAMIN) TABS tablet Take 1 tablet by mouth daily at 12 noon.    [name] azithromycin (ZITHROMAX) 250 MG tablet Take 1 tablet (250 mg total) by mouth daily. Take first 2 tablets together, then 1 every day until finished. 6 tablet 0  . ipratropium (ATROVENT) 0.06 % nasal spray Place 2 sprays into both nostrils every 4 (four) hours as needed for rhinitis. 10 mL 6  . predniSONE (DELTASONE) 10 MG tablet Take 3 tablets (30 mg total) by mouth daily with breakfast. 15 tablet 0   No current facility-administered medications for this visit.    Allergies  Allergen Reactions  . Shellfish Allergy Nausea And Vomiting    Health Maintenance Health Maintenance  Topic Date Due  . PAP SMEAR  02/06/2018  . TETANUS/TDAP  10/14/2025  . INFLUENZA VACCINE  Completed  . HIV Screening  Completed     Exam:  BP 110/67   Pulse 79   Wt 155 lb (70.3 kg)   LMP 09/25/2016   SpO2 100%   BMI 27.24 kg/m  Gen: Well NAD HEENT: EOMI,  MMM Clear nasal discharge. Posterior pharynx cobblestoning. Left tympanic membrane with clear effusion right normal. I'll cervical lymphadenopathy present bilaterally. Lungs: Normal work of breathing. CTABL Heart: RRR no MRG Abd: NABS, Soft. Nondistended,  Nontender Exts: Brisk capillary refill, warm and well perfused.    No results found for this or any previous visit (from the past 72 hour(s)). No results found.    Assessment and Plan: 36 y.o. female with viral URI. Plan to continue over-the-counter symptomatic management. We'll add cetirizine as well as Atrovent nasal spray. Use prednisone and azithromycin as a backup if worsening or not improving. Discussed risks of these medications with patient who expresses understanding and agreement.   No orders of the defined  types were placed in this encounter.  Meds ordered this encounter  Medications  . predniSONE (DELTASONE) 10 MG tablet    Sig: Take 3 tablets (30 mg total) by mouth daily with breakfast.    Dispense:  15 tablet    Refill:  0  . azithromycin (ZITHROMAX) 250 MG tablet    Sig: Take 1 tablet (250 mg total) by mouth daily. Take first 2 tablets together, then 1 every day until finished.    Dispense:  6 tablet    Refill:  0  . ipratropium (ATROVENT) 0.06 % nasal spray    Sig: Place 2 sprays into both nostrils every 4 (four) hours as needed for rhinitis.    Dispense:  10 mL    Refill:  6     Discussed warning signs or symptoms. Please see discharge instructions. Patient expresses understanding.

## 2016-10-28 ENCOUNTER — Encounter: Payer: Self-pay | Admitting: Physician Assistant

## 2016-10-28 MED ORDER — FLUOXETINE HCL 20 MG PO CAPS: 20.0000 mg | ORAL_CAPSULE | Freq: Every day | ORAL | 1 refills | Status: DC

## 2016-10-28 MED FILL — FLUoxetine HCL 20 MG CAPS: 20 | 30 days supply | Qty: 30 | Fill #0

## 2016-11-05 MED FILL — BUPROPION SR 150 MG TABLET: 150 | 90 days supply | Qty: 90 | Fill #1

## 2016-11-05 MED FILL — FLUoxetine HCL 40 MG CAPS: 40 | 90 days supply | Qty: 90 | Fill #1

## 2016-11-19 IMAGING — US US MFM OB DETAIL+14 WK
1 series · 14 of 28 positions shown · non-contrast
Comparison: none

[Series 1: us mfm ob detail+14 wk · 85 acquisitions, 14 frames shown]
[im 4/85]
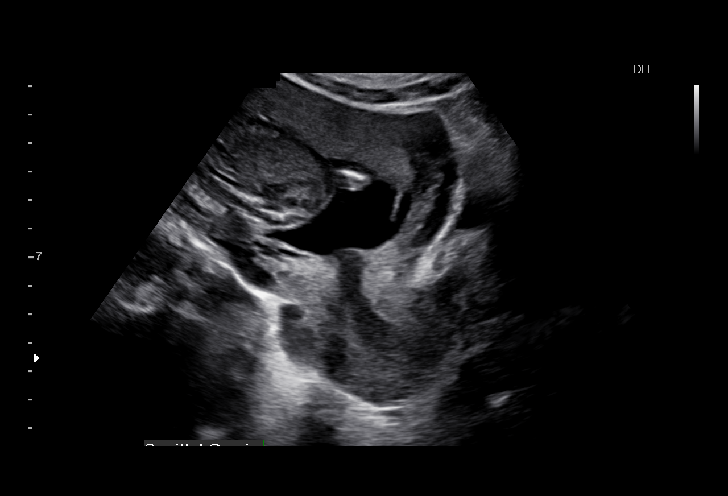
[im 10/85]
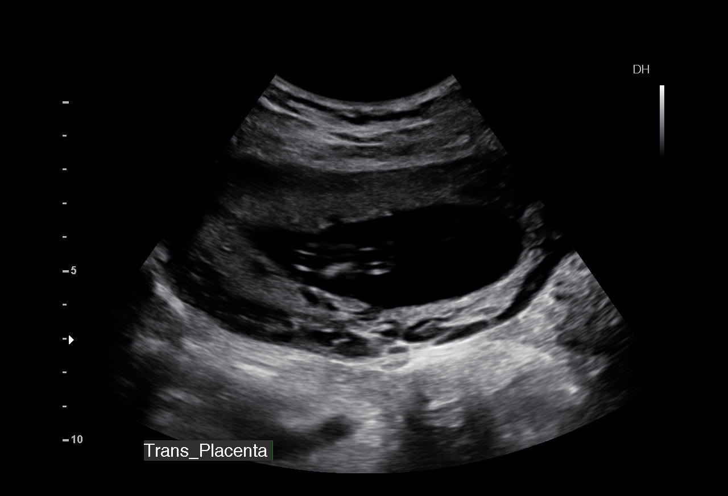
[im 16/85]
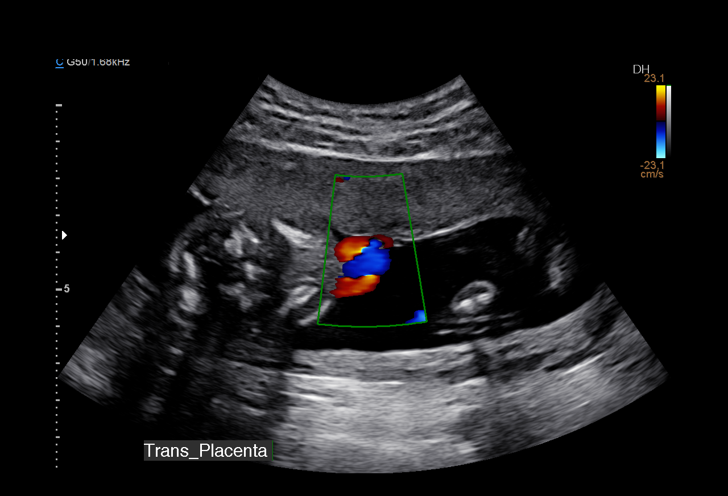
[im 22/85]
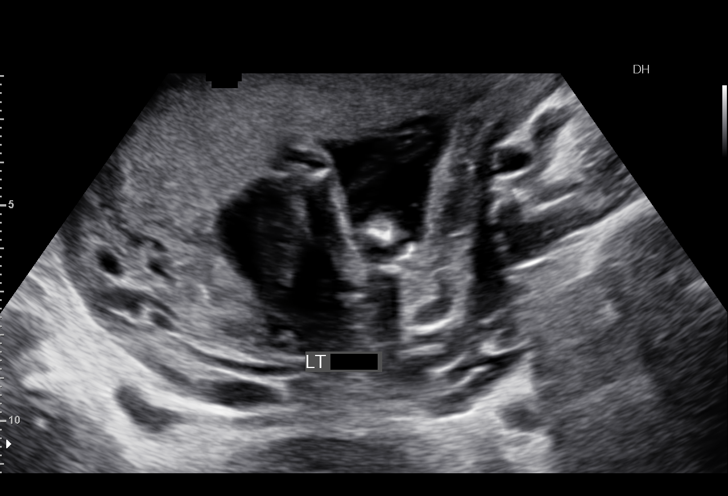
[im 29/85]
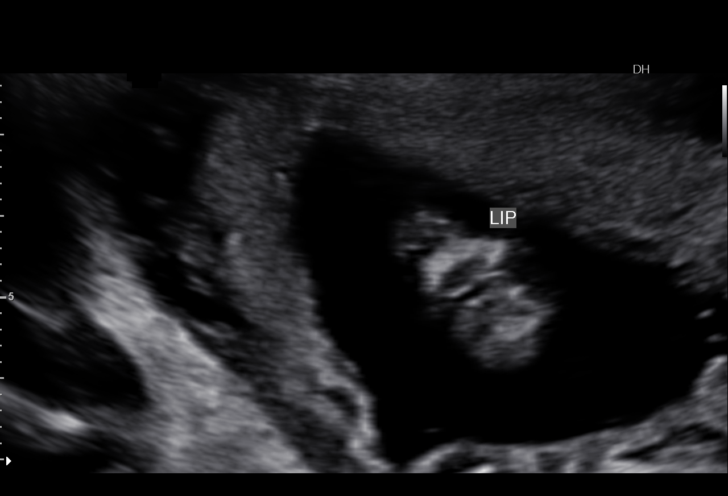
[im 35/85]
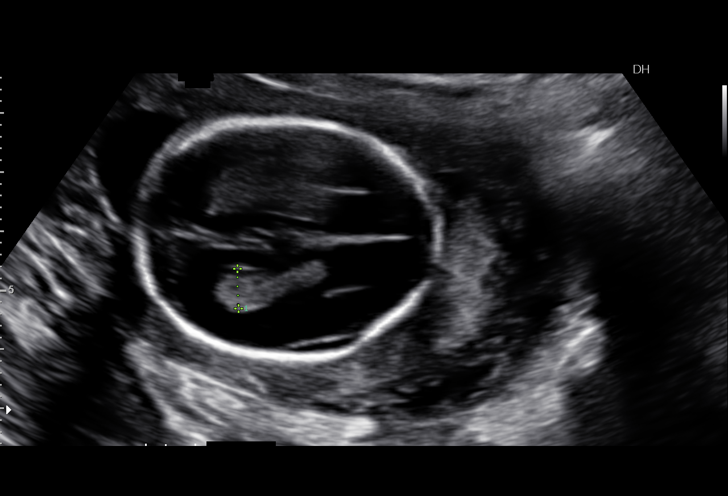
[im 41/85]
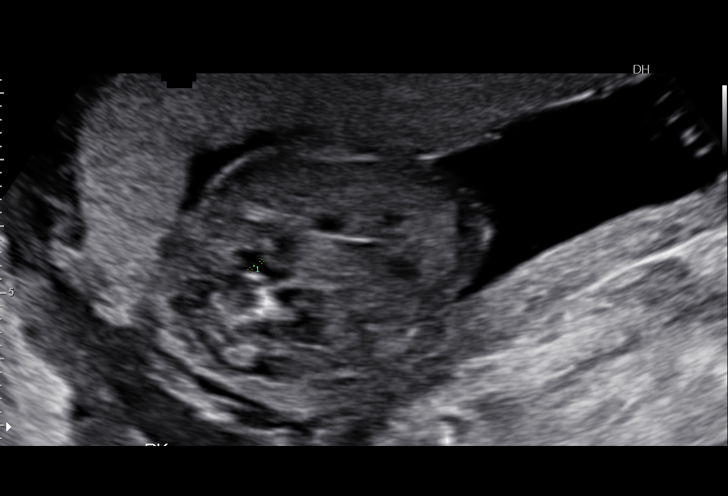
[im 47/85]
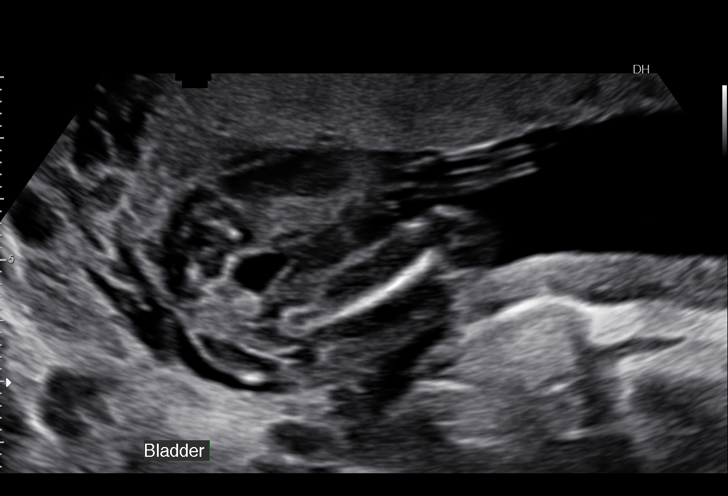
[im 53/85]
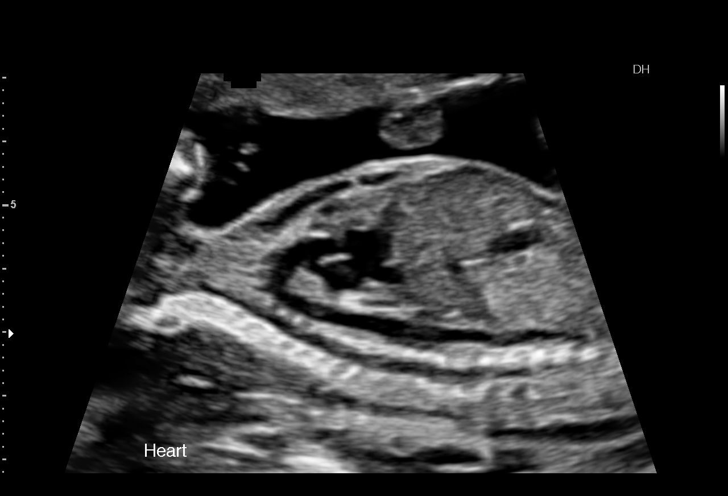
[im 60/85]
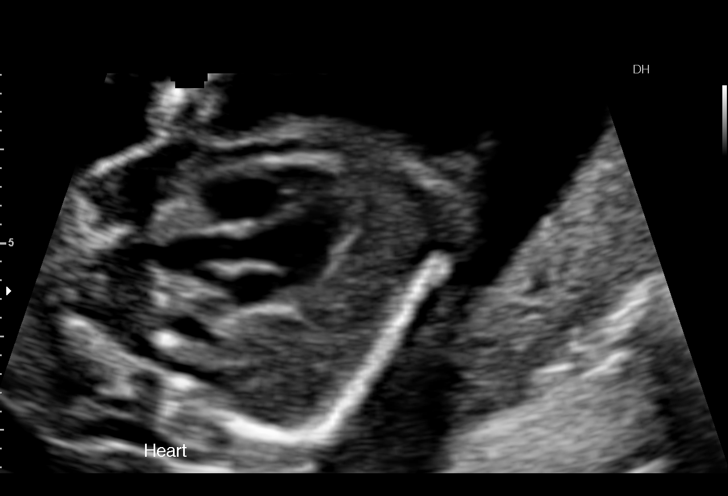
[im 66/85]
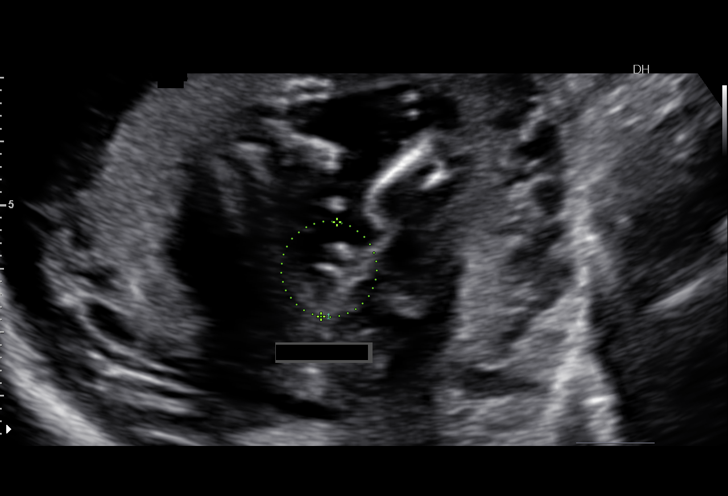
[im 72/85]
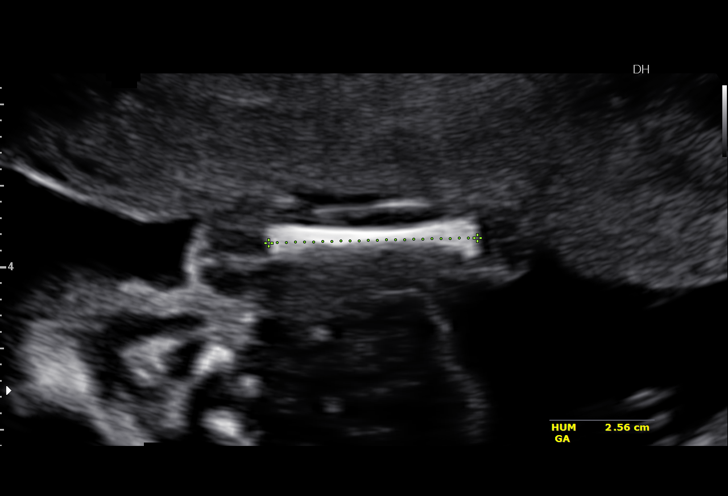
[im 78/85]
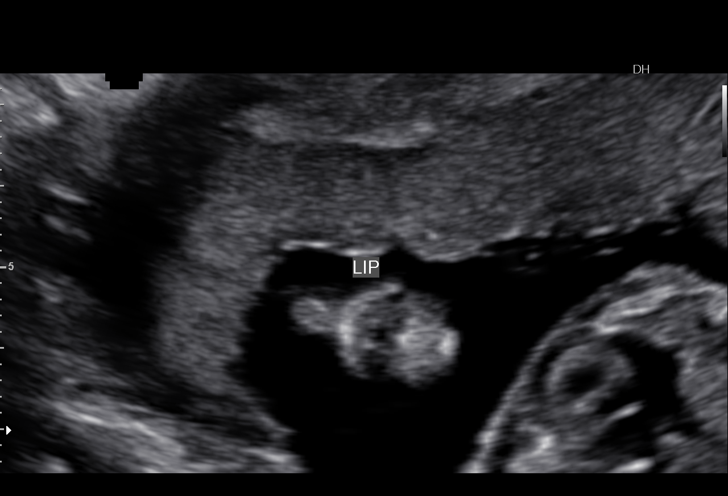
[im 85/85]
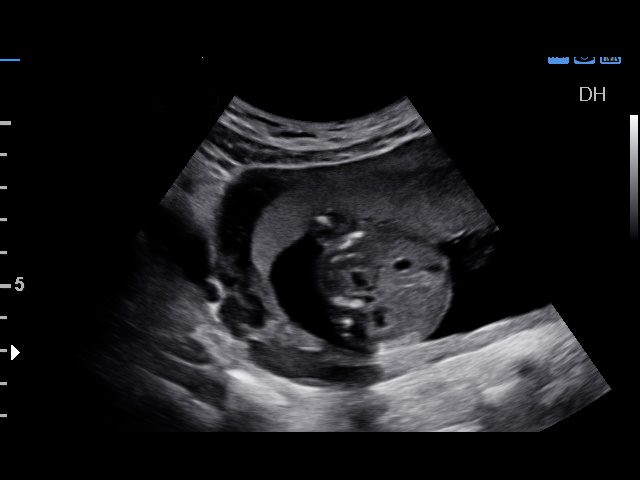

[14 of 28 positions shown; findings below may reference images not displayed]

pm)

Name:       ALIA TIGER                         Visit  08/10/2015 [DATE]
Date:

Faculty Physician
GES[facility]T                                       [place]

1  ALEN CHAUDRY           [id number]       [phone]     [id number]
Indications

18 weeks gestation of pregnancy
Detailed fetal anatomic survey                  Z36
Advanced maternal age multigravida 35,
second trimester - low risk NIPS
OB History

Gravidity:     2         Term:  1        Prem:    0        SAB:   0
TOP:           0       Ectopic  0        Living:  1
:
Fetal Evaluation

Num Of Fetuses:      1
Fetal Heart          148
Rate(bpm):
Cardiac Activity:    Observed
Presentation:        Breech
Placenta:            Anterior Rt, low-lying, 0.6 cm from os
P. Cord Insertion:   Visualized, central
Amniotic Fluid
AFI FV:      Subjectively within normal limits
Larg Pckt:      4.0  cm
Biometry

BPD:      42.2  mm     G. Age:   18w 5d                  CI:        74.66   %    70 - 86
FL/HC:      16.6   %    16.1 -
HC:        155  mm     G. Age:   18w 3d        37   %    HC/AC:      1.16        1.09 -
AC:      133.2  mm     G. Age:   18w 6d        54   %    FL/BPD      60.9   %
:
FL:       25.7  mm     G. Age:   17w 6d        19   %    FL/AC:      19.3   %    20 - 24
HUM:      25.6  mm     G. Age:   18w 0d        37   %
CER:      18.6  mm     G. Age:   18w 2d        41   %
NFT:       3.5  mm
CM:        4.3  mm

Est.         236   gm    0 lb 8 oz      41   %
FW:
Gestational Age

LMP:           18w 4d        Date:  04/02/15                  EDD:   01/07/16
U/S Today:     18w 3d                                         EDD:   01/08/16
Best:          18w 4d    Det. By:   LMP  (04/02/15)           EDD:   01/07/16
Anatomy

Cranium:          Appears normal         Aortic Arch:       Appears normal
Fetal Cavum:      Appears normal         Ductal Arch:       Appears normal
Ventricles:       Appears normal         Diaphragm:         Appears normal
Choroid Plexus:   Appears normal         Stomach:           Appears normal,
left sided
Cerebellum:       Appears normal         Abdomen:           Appears normal
Posterior         Appears normal         Abdominal          Appears nml (cord
Fossa:                                   Wall:              insert, abd wall)
Nuchal Fold:      Appears normal         Cord Vessels:      Appears normal (3
vessel cord)
Face:             Appears normal         Kidneys:           Appear normal
(orbits and profile)
Lips:             Appears normal         Bladder:           Appears normal
Fetal Thoracic:   Appears normal         Spine:             Not well visualized
Heart:            Appears normal         Upper              Appears normal
(4CH, axis, and        Extremities:
situs)
RVOT:             Appears normal         Lower              Appears normal
Extremities:
LVOT:             Appears normal

Other:   Fetus appears to be a male. Heels and 5th digit visualized. Nasal
bone visualized. Technically difficult due to fetal position.
Cervix Uterus Adnexa

Cervix
Length:             3.7  cm.
Normal appearance by transabdominal scan.
Left Ovary
Within normal limits.

Right Ovary
Within normal limits.

Adnexa:       No abnormality visualized.
Impression

Single IUP at 18w 4d
Normal fetal anatomic survey
No markers associated with aneuploidy noted
Right, anterior low lyinig placenta - the leading edge of the
placenta is approximately 1 cm from the internal os
(transabdominal)
Normal amniotic fluid volume
Recommendations

Recommend follow-up ultrasound examination in 
 6 weeks
to reevaluate the placental location

## 2016-12-11 MED FILL — LARIN FE 1.5-30 TABLET: 1.5-30 | 84 days supply | Qty: 84 | Fill #1

## 2016-12-12 MED FILL — APRISO ER 0.375 G CAPSULE: 0.375 | 30 days supply | Qty: 120 | Fill #0

## 2017-01-10 IMAGING — US US MFM OB FOLLOW-UP
1 series · 14 of 28 positions shown · non-contrast
Comparison: none

[Series 1: us mfm ob follow-up · 14 of 87 slices shown]
[im 4/87]
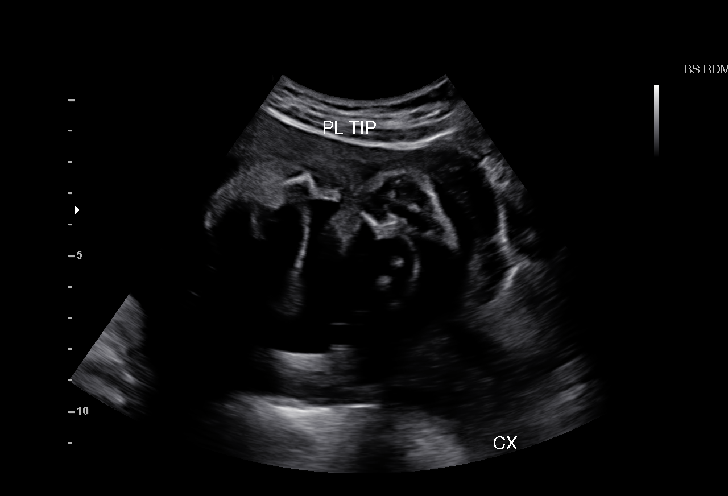
[im 10/87]
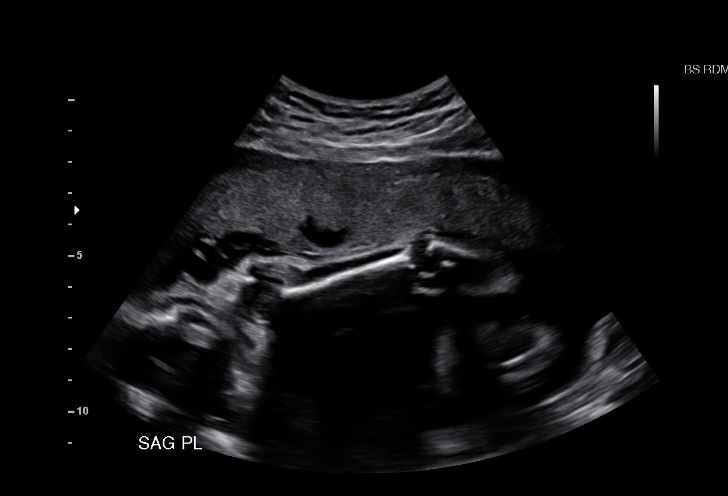
[im 16/87]
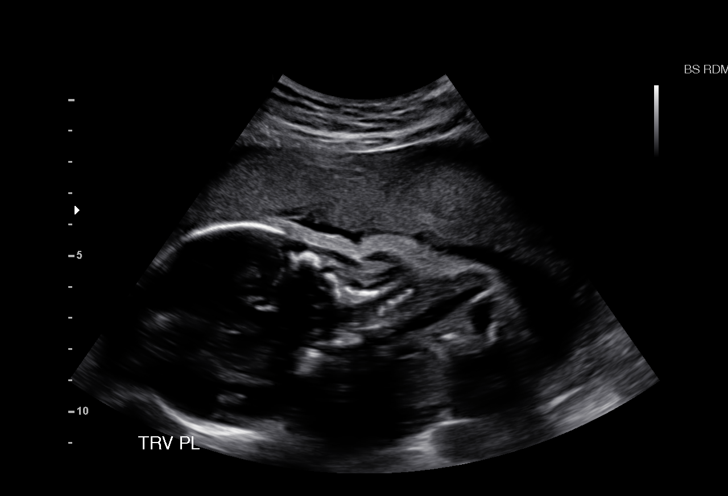
[im 23/87]
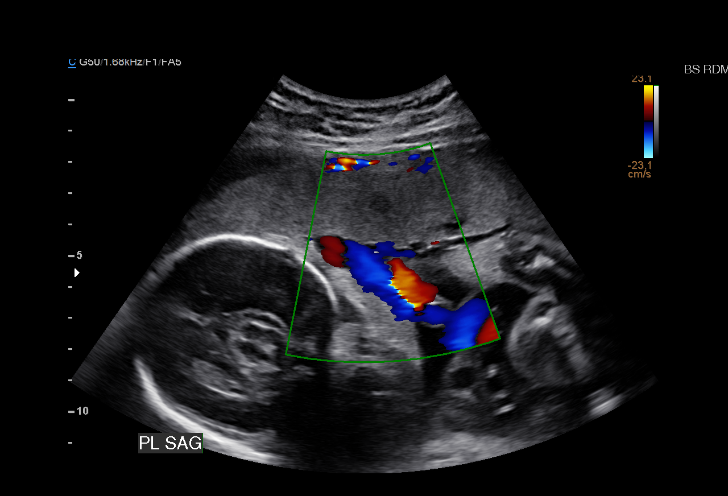
[im 29/87]
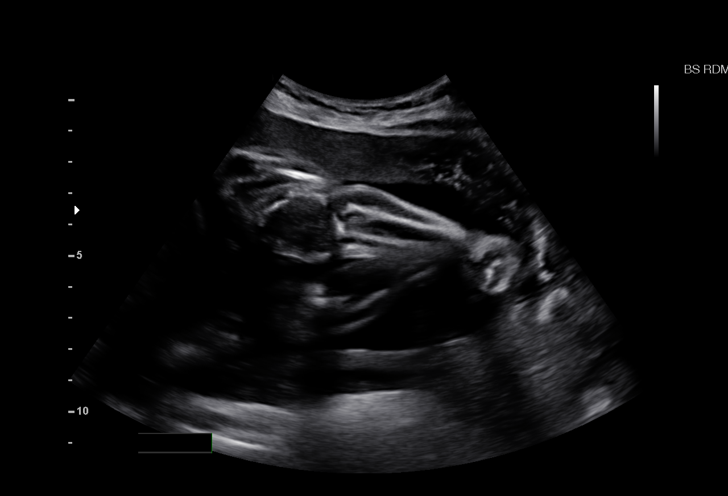
[im 36/87]
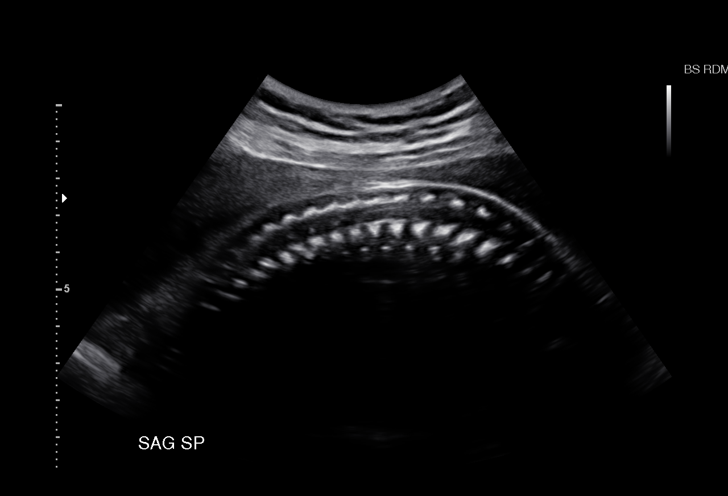
[im 42/87]
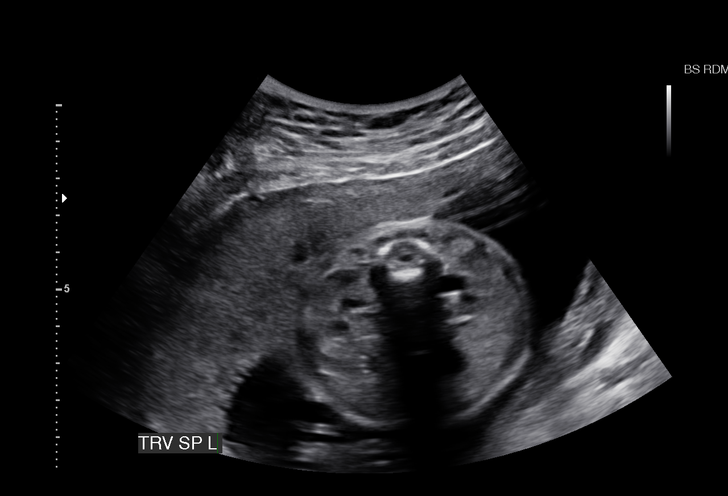
[im 48/87]
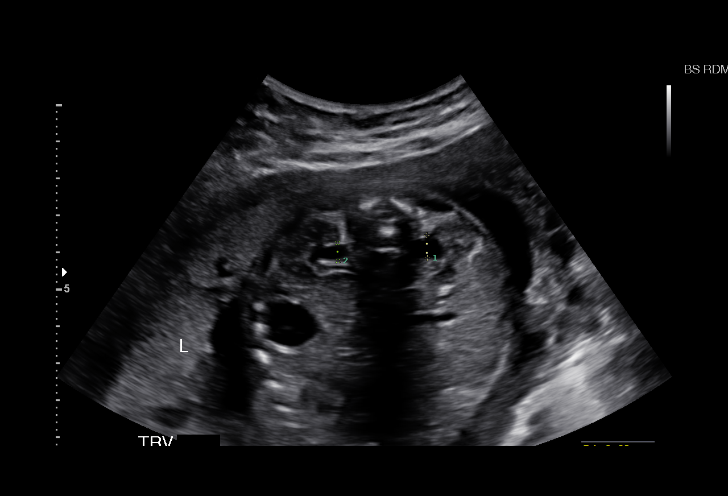
[im 55/87]
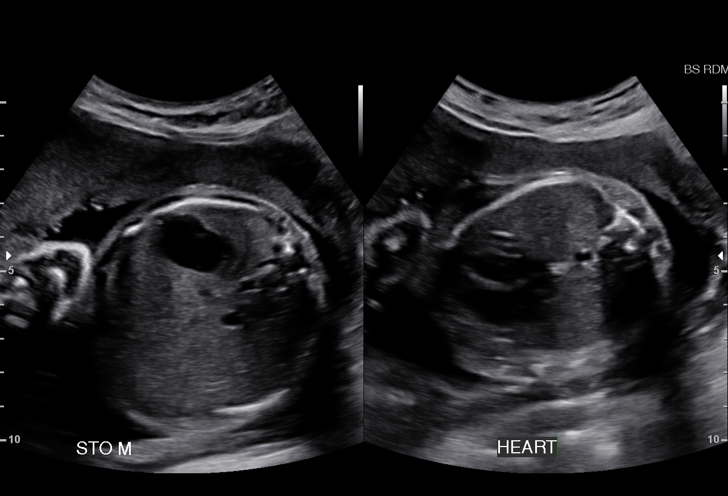
[im 61/87]
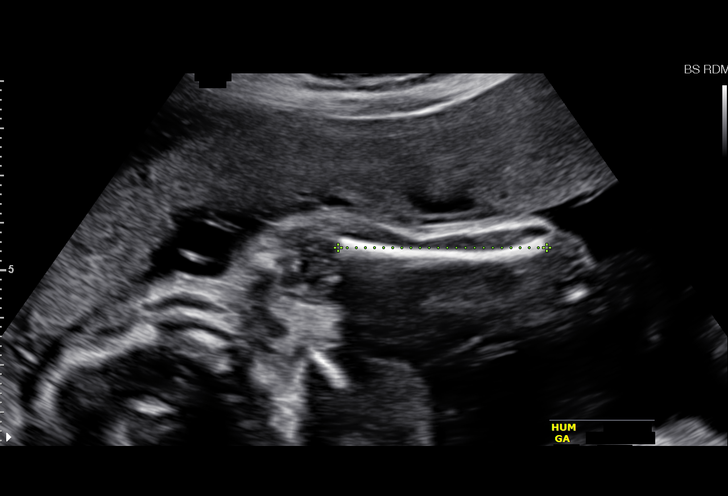
[im 67/87]
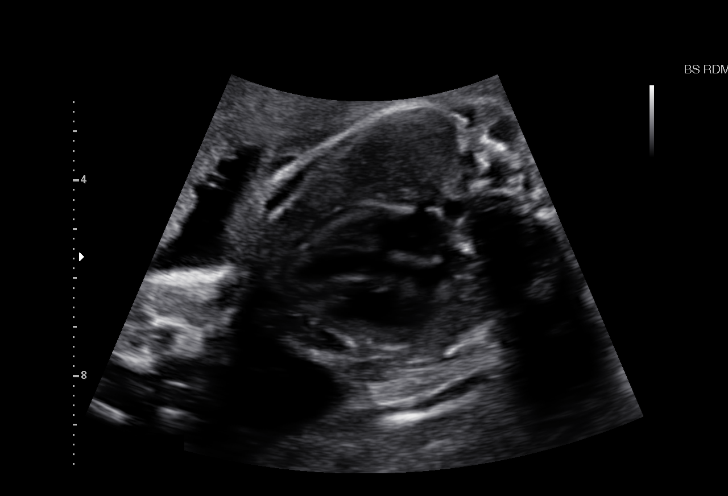
[im 74/87]
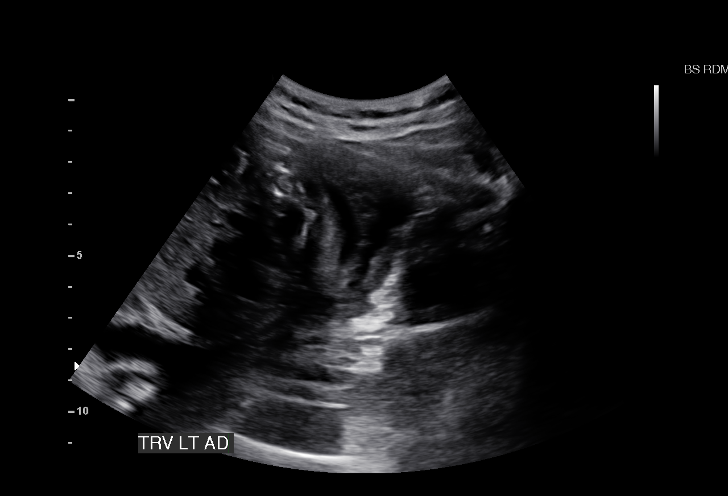
[im 80/87]
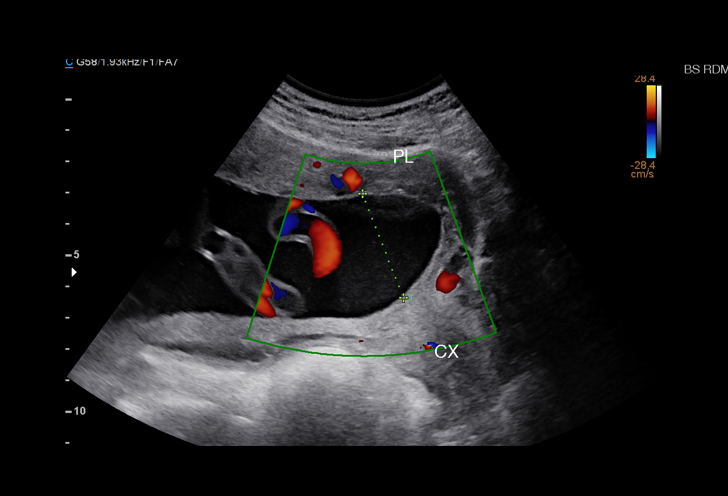
[im 87/87]
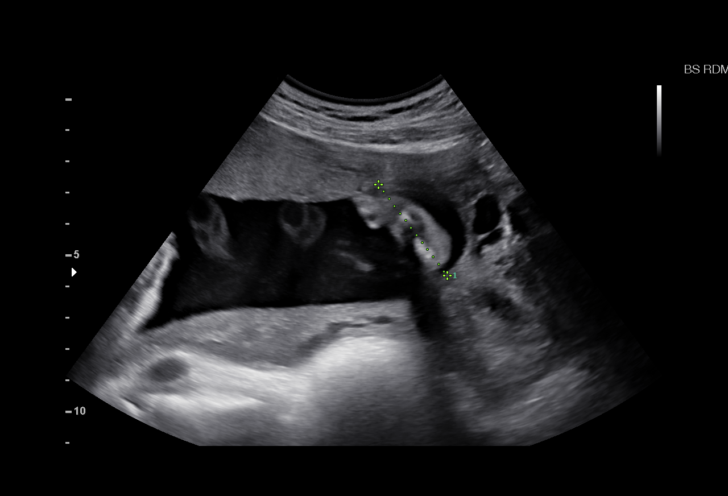

[14 of 28 positions shown; findings below may reference images not displayed]

pm)

Name:       AMNON TIGER                         Visit  10/01/2015 [DATE]
Date:

Faculty Physician
[place]

1  JAYLON AUJLA            [id number]       [id number]     [phone]
Indications

26 weeks gestation of pregnancy
Advanced maternal age multigravida 34,
second trimester - low risk NIPS
Follow-up incomplete fetal anatomic             Z36
evaluation
Low lying placenta, antepartum
OB History

Gravidity:     2         Term:  1        Prem:    0        SAB:   0
TOP:           0       Ectopic  0        Living:  1
:
Fetal Evaluation

Num Of Fetuses:      1
Fetal Heart          138
Rate(bpm):
Cardiac Activity:    Observed
Presentation:        Breech
Placenta:            Anterior Right, above cervical os
P. Cord Insertion:   Visualized, central
Amniotic Fluid
AFI FV:      Subjectively within normal limits
Larg Pckt:      4.4  cm
Biometry

BPD:      65.4  mm     G. Age:   26w 3d                  CI:        67.59   %    70 - 86
FL/HC:      17.5   %    18.6 -
HC:      254.6  mm     G. Age:   27w 5d        80   %    HC/AC:      1.09        1.04 -
AC:      233.9  mm     G. Age:   27w 5d        87   %    FL/BPD      68.0   %    71 - 87
:
FL:       44.5  mm     G. Age:   24w 5d          8  %    FL/AC:      19.0   %    20 - 24
HUM:      43.6  mm     G. Age:   26w 0d        46   %

Est.         955   gm    2 lb 2 oz      65   %
FW:
Gestational Age

LMP:           26w 0d        Date:  04/02/15                  EDD:   01/07/16
U/S Today:     26w 5d                                         EDD:   01/02/16
Best:          26w 0d    Det. By:   LMP  (04/02/15)           EDD:   01/07/16
Anatomy

Cranium:          Appears normal         Aortic Arch:       Previously seen
Fetal Cavum:      Previously seen        Ductal Arch:       Previously seen
Ventricles:       Appears normal         Diaphragm:         Previously seen
Choroid Plexus:   Previously seen        Stomach:           Appears normal,
left sided
Cerebellum:       Previously seen        Abdomen:           Previously seen
Posterior         Previously seen        Abdominal          Previously seen
Fossa:                                   Wall:
Nuchal Fold:      Previously seen        Cord Vessels:      Previously seen
Face:             Orbits and profile     Kidneys:           Bilat pyelectasis,
previously seen                           Rt 6mm, Lt 5 mm
Lips:             Previously seen        Bladder:           Appears normal
Fetal Thoracic:   Appears normal         Spine:             Appears normal
Previously seen
Heart:            Previously seen        Upper              Previously seen
Extremities:
RVOT:             Previously seen        Lower              Previously seen
Extremities:
LVOT:             Appears normal

Other:   Fetus appears to be a male. Heels and 5th digit visualized
prebviously. Nasal bone visualized previously. Technically difficult
due to fetal position.
Cervix Uterus Adnexa

Cervix
Length:             4.5  cm.
Normal appearance by transabdominal scan.
Uterus
No abnormality visualized.

Left Ovary
Not visualized.

Right Ovary
Not visualized.

Cul De        No free fluid seen.
Sac:

Adnexa:       No abnormality visualized.
Impression

SIUP at 26+0 weeks
Bilateral UTD A1 (pyelectasis)
Normal interval anatomy; anatomic survey complete
Normal amniotic fluid volume
Appropriate interval growth with EFW at the 65th %tile
Anterior/right placenta without previa
Recommendations

Follow-up ultrasound in 6-8 weeks to reassess kidneys

## 2017-01-14 MED FILL — APRISO ER 0.375 G CAPSULE: 0.375 | 30 days supply | Qty: 120 | Fill #1

## 2017-02-03 MED FILL — BUPROPION SR 150 MG TABLET: 150 | 90 days supply | Qty: 90 | Fill #2

## 2017-02-03 MED FILL — FLUoxetine HCL 40 MG CAPS: 40 | 90 days supply | Qty: 90 | Fill #2

## 2017-02-17 MED FILL — LARIN FE 1.5-30 TABLET: 1.5-30 | 84 days supply | Qty: 84 | Fill #2

## 2017-02-17 MED FILL — APRISO ER 0.375 G CAPSULE: 0.375 | 30 days supply | Qty: 120 | Fill #2

## 2017-03-18 MED FILL — APRISO ER 0.375 G CAPSULE: 0.375 | 30 days supply | Qty: 120 | Fill #3

## 2017-04-15 MED FILL — APRISO ER 0.375 G CAPSULE: 0.375 | 30 days supply | Qty: 120 | Fill #4

## 2017-05-11 MED FILL — APRISO ER 0.375 G CAPSULE: 0.375 | 30 days supply | Qty: 120 | Fill #5

## 2017-05-11 MED FILL — FLUoxetine HCL 40 MG CAPS: 40 | 90 days supply | Qty: 90 | Fill #3

## 2017-05-11 MED FILL — BUPROPION SR 150 MG TABLET: 150 | 90 days supply | Qty: 90 | Fill #3

## 2017-05-15 ENCOUNTER — Encounter: Payer: Self-pay | Admitting: Physician Assistant

## 2017-05-15 ENCOUNTER — Ambulatory Visit (INDEPENDENT_AMBULATORY_CARE_PROVIDER_SITE_OTHER): Payer: 59 | Admitting: Physician Assistant

## 2017-05-15 VITALS — BP 115/73 | HR 68 | Wt 165.0 lb

## 2017-05-15 DIAGNOSIS — Z131 Encounter for screening for diabetes mellitus: Secondary | ICD-10-CM

## 2017-05-15 DIAGNOSIS — F411 Generalized anxiety disorder: Secondary | ICD-10-CM

## 2017-05-15 DIAGNOSIS — Z0000 Encounter for general adult medical examination without abnormal findings: Secondary | ICD-10-CM | POA: Diagnosis not present

## 2017-05-15 DIAGNOSIS — F331 Major depressive disorder, recurrent, moderate: Secondary | ICD-10-CM

## 2017-05-15 DIAGNOSIS — Z13220 Encounter for screening for lipoid disorders: Secondary | ICD-10-CM | POA: Diagnosis not present

## 2017-05-15 MED ORDER — BUPROPION HCL ER (SR) 150 MG PO TB12: 150.0000 mg | ORAL_TABLET | Freq: Every day | ORAL | 4 refills | Status: DC

## 2017-05-15 MED ORDER — FLUOXETINE HCL 40 MG PO CAPS: 40.0000 mg | ORAL_CAPSULE | Freq: Every day | ORAL | 4 refills | Status: DC

## 2017-05-15 NOTE — Progress Notes (Signed)
j Subjective:     Adriana Frazier is a 36 y.o. female and is here for a comprehensive physical exam. The patient reports no problems.  Social History   Social History  . Marital status: Married    Spouse name: N/A  . Number of children: 1   . Years of education: N/A   Occupational History  . therapist     Social History Main Topics  . Smoking status: Never Smoker  . Smokeless tobacco: Never Used  . Alcohol use 0.0 oz/week     Comment: rarely.   . Drug use: No  . Sexual activity: Yes    Birth control/ protection: None, Pill   Other Topics Concern  . Not on file   Social History Narrative   Works out about 5 days per week.  No regular caffeine.    Health Maintenance  Topic Date Due  . INFLUENZA VACCINE  05/15/2018 (Originally 03/18/2017)  . PAP SMEAR  02/06/2018  . TETANUS/TDAP  10/14/2025  . HIV Screening  Completed    The following portions of the patient's history were reviewed and updated as appropriate: allergies, current medications, past family history, past medical history, past social history, past surgical history and problem list.  Review of Systems A comprehensive review of systems was negative.   Objective:    BP 115/73   Pulse 68   Wt 165 lb (74.8 kg)   BMI 29.00 kg/m  General appearance: alert, cooperative and appears stated age Head: Normocephalic, without obvious abnormality, atraumatic Eyes: conjunctivae/corneas clear. PERRL, EOM's intact. Fundi benign. Ears: normal TM's and external ear canals both ears Nose: Nares normal. Septum midline. Mucosa normal. No drainage or sinus tenderness. Throat: lips, mucosa, and tongue normal; teeth and gums normal Neck: no adenopathy, no carotid bruit, no JVD, supple, symmetrical, trachea midline and thyroid not enlarged, symmetric, no tenderness/mass/nodules Back: symmetric, no curvature. ROM normal. No CVA tenderness. Lungs: clear to auscultation bilaterally Breasts: normal appearance, no masses or  tenderness Heart: regular rate and rhythm, S1, S2 normal, no murmur, click, rub or gallop Abdomen: soft, non-tender; bowel sounds normal; no masses,  no organomegaly Pelvic: external genitalia normal, no adnexal masses or tenderness, no cervical motion tenderness, uterus normal size, shape, and consistency and vagina normal without discharge Extremities: extremities normal, atraumatic, no cyanosis or edema Pulses: 2+ and symmetric Skin: Skin color, texture, turgor normal. No rashes or lesions Lymph nodes: Cervical, supraclavicular, and axillary nodes normal. Neurologic: Alert and oriented X 3, normal strength and tone. Normal symmetric reflexes. Normal coordination and gait    Assessment:    Healthy female exam.      Plan:   Marland KitchenMarland KitchenClair was seen today for annual exam.  Diagnoses and all orders for this visit:  Routine physical examination -     COMPLETE METABOLIC PANEL WITH GFR -     Lipid Panel w/reflex Direct LDL -     TSH  Screening for lipid disorders -     Lipid Panel w/reflex Direct LDL  Screening for diabetes mellitus -     COMPLETE METABOLIC PANEL WITH GFR  Moderate episode of recurrent major depressive disorder (HCC) -     buPROPion (WELLBUTRIN SR) 150 MG 12 hr tablet; Take 1 tablet (150 mg total) by mouth daily. -     FLUoxetine (PROZAC) 40 MG capsule; Take 1 capsule (40 mg total) by mouth daily.  Generalized anxiety disorder -     buPROPion (WELLBUTRIN SR) 150 MG 12 hr tablet; Take 1 tablet (  150 mg total) by mouth daily. -     FLUoxetine (PROZAC) 40 MG capsule; Take 1 capsule (40 mg total) by mouth daily.   .. Depression screen [facility] 2/9 05/15/2017 08/06/2015  Decreased Interest 0 0  Down, Depressed, Hopeless 0 0  PHQ - 2 Score 0 0  Altered sleeping 0 -  Tired, decreased energy 1 -  Change in appetite 0 -  Feeling bad or failure about yourself  0 -  Trouble concentrating 0 -  Moving slowly or fidgety/restless 0 -  PHQ-9 Score 1 -   . Discussed 150 minutes of  exercise a week.  Encouraged vitamin D 1000 units and Calcium  or 4 servings of dairy a day.   Pap up to date.  No family hx of BC.   Refilled wellbutrin/prozac she is down to her  dose. I took  off list. She is doing well.   UC managed by GI.   She will get a flu shot via work.    See After Visit Summary for Counseling Recommendations

## 2017-05-15 NOTE — Patient Instructions (Signed)

## 2017-05-25 MED FILL — LARIN FE 1.5-30 TABLET: 1.5-30 | 84 days supply | Qty: 84 | Fill #3

## 2017-06-16 MED FILL — APRISO ER 0.375 G CAPSULE: 0.375 | 30 days supply | Qty: 120 | Fill #6

## 2017-07-15 DIAGNOSIS — Z13220 Encounter for screening for lipoid disorders: Secondary | ICD-10-CM | POA: Diagnosis not present

## 2017-07-15 DIAGNOSIS — Z131 Encounter for screening for diabetes mellitus: Secondary | ICD-10-CM | POA: Diagnosis not present

## 2017-07-15 DIAGNOSIS — Z0000 Encounter for general adult medical examination without abnormal findings: Secondary | ICD-10-CM | POA: Diagnosis not present

## 2017-07-16 LAB — LIPID PANEL W/REFLEX DIRECT LDL
Cholesterol: 237 mg/dL — ABNORMAL HIGH (ref <200)
HDL: 55 mg/dL (ref >50)
LDL Cholesterol (Calc): 155 mg/dL (calc) — ABNORMAL HIGH
Non-HDL Cholesterol (Calc): 182 mg/dL (calc) — ABNORMAL HIGH (ref <130)
Total CHOL/HDL Ratio: 4.3 (calc) (ref <5.0)
Triglycerides: 145 mg/dL (ref <150)

## 2017-07-16 LAB — COMPLETE METABOLIC PANEL WITHOUT GFR
AG Ratio: 1.3 (calc) (ref 1.0–2.5)
ALT: 21 U/L (ref 6–29)
AST: 19 U/L (ref 10–30)
Albumin: 3.9 g/dL (ref 3.6–5.1)
Alkaline phosphatase (APISO): 39 U/L (ref 33–115)
BUN: 14 mg/dL (ref 7–25)
CO2: 27 mmol/L (ref 20–32)
Calcium: 9.1 mg/dL (ref 8.6–10.2)
Chloride: 103 mmol/L (ref 98–110)
Creat: 0.76 mg/dL (ref 0.50–1.10)
GFR, Est African American: 116 mL/min/1.73m2
GFR, Est Non African American: 100 mL/min/1.73m2
Globulin: 3 g/dL (ref 1.9–3.7)
Glucose, Bld: 76 mg/dL (ref 65–99)
Potassium: 4 mmol/L (ref 3.5–5.3)
Sodium: 137 mmol/L (ref 135–146)
Total Bilirubin: 0.4 mg/dL (ref 0.2–1.2)
Total Protein: 6.9 g/dL (ref 6.1–8.1)

## 2017-07-16 LAB — TSH: TSH: 0.82 mIU/L

## 2017-07-16 MED FILL — APRISO ER 0.375 G CAPSULE: 0.375 | 30 days supply | Qty: 120 | Fill #7

## 2017-08-06 ENCOUNTER — Encounter: Payer: Self-pay | Admitting: Physician Assistant

## 2017-08-06 DIAGNOSIS — F411 Generalized anxiety disorder: Secondary | ICD-10-CM

## 2017-08-06 DIAGNOSIS — F331 Major depressive disorder, recurrent, moderate: Secondary | ICD-10-CM

## 2017-08-06 MED ORDER — FLUOXETINE HCL 40 MG PO CAPS: 40.0000 mg | ORAL_CAPSULE | Freq: Every day | ORAL | 1 refills | Status: DC

## 2017-08-06 MED ORDER — BUPROPION HCL ER (SR) 150 MG PO TB12: 150.0000 mg | ORAL_TABLET | Freq: Every day | ORAL | 1 refills | Status: DC

## 2017-08-06 MED ORDER — NORETHIN ACE-ETH ESTRAD-FE 1.5-30 MG-MCG PO TABS: 1.0000 | ORAL_TABLET | Freq: Every day | ORAL | 11 refills | Status: DC

## 2017-08-07 ENCOUNTER — Encounter: Payer: Self-pay | Admitting: Physician Assistant

## 2017-08-07 ENCOUNTER — Ambulatory Visit (INDEPENDENT_AMBULATORY_CARE_PROVIDER_SITE_OTHER): Payer: 59 | Admitting: Physician Assistant

## 2017-08-07 VITALS — BP 120/55 | HR 72 | Ht 63.25 in | Wt 171.0 lb

## 2017-08-07 DIAGNOSIS — R300 Dysuria: Secondary | ICD-10-CM

## 2017-08-07 DIAGNOSIS — N3000 Acute cystitis without hematuria: Secondary | ICD-10-CM

## 2017-08-07 LAB — POCT URINALYSIS DIPSTICK
BILIRUBIN UA: NEGATIVE
Blood, UA: NEGATIVE
Glucose, UA: NEGATIVE
KETONES UA: NEGATIVE
Nitrite, UA: NEGATIVE
PH UA: 5 (ref 5.0–8.0)
Protein, UA: NEGATIVE
Spec Grav, UA: >=1.03 — AB (ref 1.010–1.025)
UROBILINOGEN UA: 0.2 U/dL

## 2017-08-07 MED ORDER — CIPROFLOXACIN HCL 500 MG PO TABS: 500.0000 mg | ORAL_TABLET | Freq: Two times a day (BID) | ORAL | 0 refills | Status: DC

## 2017-08-07 MED FILL — BUPROPION SR 150 MG TABLET: 150 | 90 days supply | Qty: 90 | Fill #0 | Status: TO

## 2017-08-07 MED FILL — CIPROFLOXACIN HCL 500 MG TA: 500 | 3 days supply | Qty: 6 | Fill #0

## 2017-08-07 MED FILL — LARIN FE 1.5-30 TABLET: 1.5-30 | 84 days supply | Qty: 84 | Fill #0 | Status: TO

## 2017-08-07 NOTE — Patient Instructions (Signed)

## 2017-08-07 NOTE — Progress Notes (Signed)
   Subjective:    Patient ID: Adriana Frazier, female    DOB: 21-Nov-1980, 36 y.o.   MRN: [id number]  HPI Patient is a 36 year old female who presents to the clinic with dysuria and urinary urgency for last 3 days. No fever, chills, body aches, abdominal pain, flank pain. She is not taking anything to make better. She does not want to get worse over weekend since it's christmas.   .. Active Ambulatory Problems    Diagnosis Date Noted  . Depression 08/04/2014  . Generalized anxiety disorder 08/04/2014  . Ulcerative colitis (HCC) 08/04/2014  . Mass of left side of neck 08/04/2014  . AMA (advanced maternal age) multigravida 35+ 05/25/2015  . Supervision of other high risk pregnancies, first trimester 06/25/2015  . Ultrasound recheck of fetal pyelectasis, antepartum 11/25/2015  . Excess or deficiency of vitamin D 11/02/2013  . Preterm premature rupture of membranes (PPROM) with onset of labor within 24 hours of rupture in third trimester, antepartum 12/12/2015  . SVD (spontaneous vaginal delivery) 12/12/2015  . Tick bite 01/01/2016  . Hyperlipidemia 06/23/2016  . Crohn's ileocolitis (HCC) 08/29/2016   Resolved Ambulatory Problems    Diagnosis Date Noted  . Low lying placenta with hemorrhage, antepartum 08/11/2015   Past Medical History:  Diagnosis Date  . Anxiety   . Depression   . Ulcerative colitis (HCC)       Review of Systems  All other systems reviewed and are negative.      Objective:   Physical Exam  Constitutional: She is oriented to person, place, and time. She appears well-developed and well-nourished.  HENT:  Head: Normocephalic and atraumatic.  Cardiovascular: Normal rate, regular rhythm and normal heart sounds.  Pulmonary/Chest: Effort normal and breath sounds normal.  NO CVA tenderness.   Abdominal: Soft. Bowel sounds are normal. She exhibits no distension and no mass. There is no tenderness. There is no rebound and no guarding.  Neurological: She is alert and  oriented to person, place, and time.  Psychiatric: She has a normal mood and affect. Her behavior is normal.          Assessment & Plan:  [name].[name].Adriana Frazier was seen today for dysuria and urinary urgency.  Diagnoses and all orders for this visit:  Acute cystitis without hematuria -     ciprofloxacin (CIPRO) 500 MG tablet; Take 1 tablet (500 mg total) by mouth 2 (two) times daily. For 3 days.  Dysuria -     POCT urinalysis dipstick    .[name]. Results for orders placed or performed in visit on 08/07/17  POCT urinalysis dipstick  Result Value Ref Range   Color, UA yellow    Clarity, UA clear    Glucose, UA neg    Bilirubin, UA neg    Ketones, UA neg    Spec Grav, UA >=1.030 (A) 1.010 - 1.025   Blood, UA neg    pH, UA 5.0 5.0 - 8.0   Protein, UA neg    Urobilinogen, UA 0.2 0.2 or 1.0 E.U./dL   Nitrite, UA neg    Leukocytes, UA Trace (A) Negative   Appearance     Odor     Not enough of sample to send for culture.  Treated with cipro 500mg  bid for 3 days.  HO given for symptomatic care and prevention.  Follow up if not improving or if symptoms worsening.

## 2017-08-10 ENCOUNTER — Other Ambulatory Visit: Payer: Self-pay | Admitting: Physician Assistant

## 2017-08-12 MED ORDER — FLUOXETINE HCL 40 MG PO CAPS: 40.0000 mg | ORAL_CAPSULE | Freq: Every day | ORAL | 1 refills | Status: DC

## 2017-08-12 MED FILL — FLUoxetine HCL 40 MG CAPS: 40 | 90 days supply | Qty: 90 | Fill #0 | Status: TO

## 2017-08-12 NOTE — Addendum Note (Signed)
Addended by: Collie SiadICHARDSON, Jahari Billy M on: 08/12/2017 10:45 AM   Modules accepted: Orders

## 2017-08-13 MED FILL — APRISO ER 0.375 G CAPSULE: 0.375 | 30 days supply | Qty: 120 | Fill #8

## 2017-09-14 MED FILL — APRISO ER 0.375 G CAPSULE: 0.375 | 30 days supply | Qty: 120 | Fill #9

## 2017-09-18 MED FILL — IPRATROPIUM 0.06% SPRAY: 0.06 | 30 days supply | Qty: 15 | Fill #1

## 2017-10-23 DIAGNOSIS — K5180 Other ulcerative colitis without complications: Secondary | ICD-10-CM | POA: Diagnosis not present

## 2017-10-23 MED FILL — APRISO ER 0.375 G CAPSULE: 0.375 | 90 days supply | Qty: 360 | Fill #0 | Status: TO

## 2017-10-27 ENCOUNTER — Encounter: Payer: Self-pay | Admitting: Physician Assistant

## 2017-10-28 MED ORDER — HYDROCORTISONE 2.5 % RE CREA: 1.0000 "application " | TOPICAL_CREAM | Freq: Two times a day (BID) | RECTAL | 0 refills | Status: DC

## 2017-10-28 MED ORDER — HYDROCORTISONE ACE-PRAMOXINE 1-1 % RE FOAM: 1.0000 | Freq: Two times a day (BID) | RECTAL | 1 refills | Status: DC

## 2017-10-28 MED FILL — PROCTOZONE-HC 2.5 % CREA: 2.5 | 30 days supply | Qty: 60 | Fill #0

## 2017-10-28 MED FILL — PROCTOFOAM-HC 1%-1% FOAM: 1-1 | 10 days supply | Qty: 10 | Fill #0 | Status: TO

## 2017-11-02 MED FILL — BUPROPION SR 150 MG TABLET: 150 | 90 days supply | Qty: 90 | Fill #0

## 2017-11-02 MED FILL — SHIPPING COST: 1 days supply | Qty: 1 | Fill #0

## 2017-11-02 MED FILL — LARIN FE 1.5-30 TABLET: 1.5-30 | 84 days supply | Qty: 84 | Fill #0

## 2017-11-02 MED FILL — FLUoxetine HCL 40 MG CAPS: 40 | 90 days supply | Qty: 90 | Fill #0

## 2018-01-15 ENCOUNTER — Emergency Department (INDEPENDENT_AMBULATORY_CARE_PROVIDER_SITE_OTHER)
Admission: EM | Admit: 2018-01-15 | Discharge: 2018-01-15 | Disposition: A | Discharge status: Home / Self Care | Payer: 59 | Source: Home / Self Care | Attending: Family Medicine | Admitting: Family Medicine

## 2018-01-15 ENCOUNTER — Ambulatory Visit: Payer: 59 | Admitting: Physician Assistant

## 2018-01-15 DIAGNOSIS — B354 Tinea corporis: Secondary | ICD-10-CM

## 2018-01-15 MED ORDER — CLOTRIMAZOLE 1 % EX CREA: TOPICAL_CREAM | CUTANEOUS | 0 refills | Status: DC

## 2018-01-15 MED FILL — CLOTRIMAZOLE 1% CREAM: 1 | 28 days supply | Qty: 28 | Fill #0

## 2018-01-15 NOTE — ED Provider Notes (Signed)
[facility]    CSN: [id number] Arrival date & time: 01/15/18  0934     History   Chief Complaint Chief Complaint  Patient presents with  . Rash    HPI Adriana Frazier is a 37 y.o. female.   HPI  Adriana Frazier is a 37 y.o. female presenting to UC with c/o an itchy red circular rash under her Left breast that she just noticed today.  She notes her son currently has a diaper rash and wonders if that could have been the source of her current rash. She has not tried anything for her symptoms. No other rashes or lesions.  No new soaps, lotions or medications.   Past Medical History:  Diagnosis Date  . Anxiety   . Depression   . Ulcerative colitis [facility])     Patient Active Problem List   Diagnosis Date Noted  . Crohn's ileocolitis (HCC) 08/29/2016  . Hyperlipidemia 06/23/2016  . Tick bite 01/01/2016  . Preterm premature rupture of membranes (PPROM) with onset of labor within 24 hours of rupture in third trimester, antepartum 12/12/2015  . SVD (spontaneous vaginal delivery) 12/12/2015  . Ultrasound recheck of fetal pyelectasis, antepartum 11/25/2015  . Supervision of other high risk pregnancies, first trimester 06/25/2015  . AMA (advanced maternal age) multigravida 35+ 05/25/2015  . Depression 08/04/2014  . Generalized anxiety disorder 08/04/2014  . Ulcerative colitis (HCC) 08/04/2014  . Mass of left side of neck 08/04/2014  . Excess or deficiency of vitamin D 11/02/2013    Past Surgical History:  Procedure Laterality Date  . COLONOSCOPY    . cyst in neck injected with radiation    . SIGMOIDOSCOPY      OB History    Gravida  2   Para  2   Term  1   Preterm  1   AB      Living  1     SAB      TAB      Ectopic      Multiple  0   Live Births  1            Home Medications    Prior to Admission medications   Medication Sig Start Date End Date Taking? Authorizing Provider  APRISO 0.375 g 24 hr capsule  05/11/17   [provider]  buPROPion (WELLBUTRIN SR) 150 MG 12 hr tablet Take 1 tablet (150 mg total) by mouth daily. 08/06/17   Breeback, Lonna Cobb, PA-C  Cholecalciferol (VITAMIN D-3) 1000 UNITS CAPS Take 1,000 Units by mouth daily.     [provider]  ciprofloxacin (CIPRO) 500 MG tablet Take 1 tablet (500 mg total) by mouth 2 (two) times daily. For 3 days. 08/07/17   Jomarie Longs, PA-C  clotrimazole (LOTRIMIN) 1 % cream Apply to affected area 2 times daily for up to 4 weeks 01/15/18   Lurene Shadow, PA-C  FLUoxetine (PROZAC) 40 MG capsule Take 1 capsule (40 mg total) by mouth daily. 08/12/17   Breeback, Lonna Cobb, PA-C  hydrocortisone (ANUSOL-HC) 2.5 % rectal cream Place 1 application rectally 2 (two) times daily. For hemorrhoids. 10/28/17   Breeback, Lonna Cobb, PA-C  hydrocortisone-pramoxine (PROCTOFOAM-HC) rectal foam Place 1 applicator rectally 2 (two) times daily. As needed for hemorrhoids. 10/28/17   Breeback, Jade L, PA-C  ipratropium (ATROVENT) 0.06 % nasal spray Place 2 sprays into both nostrils every 4 (four) hours as needed for rhinitis. 10/22/16   Rodolph Bong, MD  norethindrone-ethinyl  estradiol-iron (MICROGESTIN FE,GILDESS FE,LOESTRIN FE) 1.5-30 MG-MCG tablet Take 1 tablet by mouth daily. 08/06/17   Breeback, Lonna CobbJade L, PA-C  Omega 3-6-9 Fatty Acids (OMEGA 3-6-9 PO) Take 1,600 mg by mouth daily.    [provider]  Prenatal Vit-Fe Fumarate-FA (PRENATAL MULTIVITAMIN) TABS tablet Take 1 tablet by mouth daily at 12 noon.    [provider]    Family History Family History  Problem Relation Age of Onset  . Rheum arthritis Mother   . Depression Mother   . Hyperlipidemia Father   . Bipolar disorder Father   . Bipolar disorder Sister   . Bipolar disorder Paternal Uncle   . Colon cancer Unknown        grandparents.     Social History Social History   Tobacco Use  . Smoking status: Never Smoker  . Smokeless tobacco: Never Used  Substance Use Topics  . Alcohol use: Yes     Alcohol/week: 0.0 oz    Comment: rarely.   . Drug use: No     Allergies   Shellfish allergy   Review of Systems Review of Systems  Constitutional: Negative for chills and fever.  Skin: Positive for rash. Negative for wound.     Physical Exam Triage Vital Signs ED Triage Vitals [01/15/18 0950]  Enc Vitals Group     BP 118/84     Pulse Rate 76     Resp      Temp 98.6 F (37 C)     Temp Source Oral     SpO2 98 %     Weight      Height      Head Circumference      Peak Flow      Pain Score      Pain Loc      Pain Edu?      Excl. in GC?    No data found.  Updated Vital Signs BP 118/84 (BP Location: Right Arm)   Pulse 76   Temp 98.6 F (37 C) (Oral)   SpO2 98%   Visual Acuity Right Eye Distance:   Left Eye Distance:   Bilateral Distance:    Right Eye Near:   Left Eye Near:    Bilateral Near:     Physical Exam  Constitutional: She is oriented to person, place, and time. She appears well-developed and well-nourished. No distress.  HENT:  Head: Normocephalic and atraumatic.  Eyes: EOM are normal.  Neck: Normal range of motion.  Cardiovascular: Normal rate.  Pulmonary/Chest: Effort normal.  Musculoskeletal: Normal range of motion.  Neurological: She is alert and oriented to person, place, and time.  Skin: Skin is warm and dry. Rash noted. She is not diaphoretic. There is erythema.  Under Left breast: 1cm faint erythematous circular lesion with well defined border and central clearing. Non-tender. No bleeding or discharge.   Psychiatric: She has a normal mood and affect. Her behavior is normal.  Nursing note and vitals reviewed.    UC Treatments / Results  Labs (all labs ordered are listed, but only abnormal results are displayed) Labs Reviewed - No data to display  EKG None  Radiology No results found.  Procedures Procedures (including critical care time)  Medications Ordered in UC Medications - No data to display  Initial Impression /  Assessment and Plan / UC Course  I have reviewed the triage vital signs and the nursing notes.  Pertinent labs & imaging results that were available during my care of the  patient were reviewed by me and considered in my medical decision making (see chart for details).     Hx and exam c/w tinea corporis, Home care instructions provided below.     Final Clinical Impressions(s) / UC Diagnoses   Final diagnoses:  Tinea corporis     Discharge Instructions      Please use the cream as prescribed.  Be sure to wash your hands with soap and water before and after applying the cream to help prevent worsening or spreading of the rash.  Please follow up with Tandy Gaw, PA, in 2-3 weeks if not improving, sooner if worsening or spreading.     ED Prescriptions    Medication Sig Dispense Auth. Provider   clotrimazole (LOTRIMIN) 1 % cream Apply to affected area 2 times daily for up to 4 weeks 24 g Lurene Shadow, PA-C     Controlled Substance Prescriptions [place] Controlled Substance Registry consulted? Not Applicable   Rolla Plate 01/15/18 1028

## 2018-01-15 NOTE — ED Triage Notes (Signed)
Red itchy round rash under Left breast

## 2018-01-15 NOTE — Discharge Instructions (Signed)
°  Please use the cream as prescribed.  Be sure to wash your hands with soap and water before and after applying the cream to help prevent worsening or spreading of the rash.  Please follow up with Tandy Gaw, PA, in 2-3 weeks if not improving, sooner if worsening or spreading.

## 2018-01-19 MED FILL — APRISO ER 0.375 G CAPSULE: 0.375 | 90 days supply | Qty: 360 | Fill #0 | Status: TO

## 2018-01-19 MED FILL — LARIN FE 1.5-30 TABLET: 1.5-30 | 84 days supply | Qty: 84 | Fill #1

## 2018-01-19 MED FILL — SHIPPING COST: 1 days supply | Qty: 1 | Fill #1

## 2018-01-26 ENCOUNTER — Ambulatory Visit: Payer: 59 | Admitting: Skilled Nursing Facility1

## 2018-01-29 ENCOUNTER — Ambulatory Visit (INDEPENDENT_AMBULATORY_CARE_PROVIDER_SITE_OTHER): Payer: 59 | Admitting: Physician Assistant

## 2018-01-29 VITALS — BP 113/68 | HR 70 | Temp 98.1°F | Wt 171.0 lb

## 2018-01-29 DIAGNOSIS — F411 Generalized anxiety disorder: Secondary | ICD-10-CM

## 2018-01-29 DIAGNOSIS — F331 Major depressive disorder, recurrent, moderate: Secondary | ICD-10-CM

## 2018-01-29 DIAGNOSIS — B359 Dermatophytosis, unspecified: Secondary | ICD-10-CM

## 2018-01-29 MED ORDER — BUPROPION HCL ER (SR) 150 MG PO TB12: 150.0000 mg | ORAL_TABLET | Freq: Every day | ORAL | 1 refills | Status: DC

## 2018-01-29 MED ORDER — FLUOXETINE HCL 40 MG PO CAPS: 40.0000 mg | ORAL_CAPSULE | Freq: Every day | ORAL | 1 refills | Status: DC

## 2018-01-29 MED ORDER — FLUCONAZOLE 150 MG PO TABS: ORAL_TABLET | ORAL | 0 refills | Status: DC

## 2018-01-29 MED FILL — BUPROPION SR 150 MG TABLET: 150 | 90 days supply | Qty: 90 | Fill #0 | Status: TO

## 2018-01-29 MED FILL — FLUoxetine HCL 40 MG CAPS: 40 | 90 days supply | Qty: 90 | Fill #0 | Status: TO

## 2018-01-29 MED FILL — FLUCONAZOLE 150 MG TABS: 150 | 28 days supply | Qty: 8 | Fill #0

## 2018-01-29 NOTE — Progress Notes (Signed)
Subjective:    Patient ID: Adriana Frazier, female    DOB: 05-11-1981, 37 y.o.   MRN: [id number]  HPI  Pt is a 37 yo female who presents to the clinic to follow up on a rash under left breast. She went to UC 2.5 weeks ago and dx with ringworm.She was given clotrimazole which has helped some but rash has not gone away. It is slightly itchy, circular and under left breat. No one in the house has any similar lesions. She does have a dog but does not go outside. She has 2 young children.   Her mood is doing great. She has no concerns or complaints. Work is going well.   .. Active Ambulatory Problems    Diagnosis Date Noted  . Depression 08/04/2014  . Generalized anxiety disorder 08/04/2014  . Ulcerative colitis (HCC) 08/04/2014  . Mass of left side of neck 08/04/2014  . AMA (advanced maternal age) multigravida 35+ 05/25/2015  . Supervision of other high risk pregnancies, first trimester 06/25/2015  . Ultrasound recheck of fetal pyelectasis, antepartum 11/25/2015  . Excess or deficiency of vitamin D 11/02/2013  . Preterm premature rupture of membranes (PPROM) with onset of labor within 24 hours of rupture in third trimester, antepartum 12/12/2015  . SVD (spontaneous vaginal delivery) 12/12/2015  . Tick bite 01/01/2016  . Hyperlipidemia 06/23/2016  . Crohn's ileocolitis (HCC) 08/29/2016  . Ringworm 01/31/2018   Resolved Ambulatory Problems    Diagnosis Date Noted  . Low lying placenta with hemorrhage, antepartum 08/11/2015   Past Medical History:  Diagnosis Date  . Anxiety   . Depression   . Ulcerative colitis (HCC)      Review of Systems  All other systems reviewed and are negative.      Objective:   Physical Exam  Constitutional: She is oriented to person, place, and time. She appears well-developed and well-nourished.  HENT:  Head: Normocephalic and atraumatic.  Cardiovascular: Normal rate and regular rhythm.  Neurological: She is alert and oriented to person, place,  and time.  Skin:  Circular rash flesh/salmon like color with fine scales mostly on the outside ring under left breast 4cm by 3cm.   Psychiatric: She has a normal mood and affect. Her behavior is normal.          Assessment & Plan:  Marland KitchenMarland KitchenRyhanna was seen today for tinea.  Diagnoses and all orders for this visit:  Ringworm -     fluconazole (DIFLUCAN) 150 MG tablet; Take 2 tablets once a week for 4 weeks.  Moderate episode of recurrent major depressive disorder (HCC) -     buPROPion (WELLBUTRIN SR) 150 MG 12 hr tablet; Take 1 tablet (150 mg total) by mouth daily. -     FLUoxetine (PROZAC) 40 MG capsule; Take 1 capsule (40 mg total) by mouth daily.  Generalized anxiety disorder -     buPROPion (WELLBUTRIN SR) 150 MG 12 hr tablet; Take 1 tablet (150 mg total) by mouth daily. -     FLUoxetine (PROZAC) 40 MG capsule; Take 1 capsule (40 mg total) by mouth daily.   .. Depression screen [facility] 2/9 01/29/2018 05/15/2017 08/06/2015  Decreased Interest 0 0 0  Down, Depressed, Hopeless 0 0 0  PHQ - 2 Score 0 0 0  Altered sleeping 0 0 -  Tired, decreased energy 0 1 -  Change in appetite 0 0 -  Feeling bad or failure about yourself  0 0 -  Trouble concentrating 0 0 -  Moving slowly  or fidgety/restless 0 0 -  Suicidal thoughts 0 - -  PHQ-9 Score 0 1 -  Difficult doing work/chores Not difficult at all - -   .[name]. GAD 7 : Generalized Anxiety Score 01/29/2018 08/06/2015  Nervous, Anxious, on Edge 1 0  Control/stop worrying 0 0  Worry too much - different things 0 0  Trouble relaxing 0 0  Restless 0 0  Easily annoyed or irritable 1 1  Afraid - awful might happen 0 0  Total GAD 7 Score 2 1  Anxiety Difficulty Not difficult at all Not difficult at all    consistent with ringworm. Topical cream not working. Start diflucan for 4 weeks. Follow up as needed or if symptoms changes.  Can use cream as needed. Keep dry. HO given.   Refilled medications for mood. Doing well. Follow up in 6 months.

## 2018-01-29 NOTE — Patient Instructions (Signed)

## 2018-01-31 ENCOUNTER — Encounter: Payer: Self-pay | Admitting: Physician Assistant

## 2018-01-31 DIAGNOSIS — B359 Dermatophytosis, unspecified: Secondary | ICD-10-CM | POA: Insufficient documentation

## 2018-02-10 ENCOUNTER — Encounter: Payer: 59 | Attending: Physician Assistant | Admitting: Registered"

## 2018-02-10 ENCOUNTER — Encounter: Payer: Self-pay | Admitting: Registered"

## 2018-02-10 DIAGNOSIS — Z713 Dietary counseling and surveillance: Secondary | ICD-10-CM | POA: Diagnosis not present

## 2018-02-10 NOTE — Progress Notes (Signed)
Medical Nutrition Therapy:  Appt start time: 0905 end time:  0950.  Cont Employee Visit #1  Assessment:  Primary concerns today: Patient states she would like ideas for quick and healthy dinner ideas for her family. Patient states she is also interested in losing 10-20 lbs.   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 .   Patient states she will find that she wants to eat more at work when she has her clients no-show. Patient states in the evening she likes to have something sweet, but is not hungry.  Pt states she has made smoothies before but her daughter is very sensitive to noise so patient states she doesn't like to use the blender.   Sleep: 7 hrs, wakes rested  Preferred Learning Style:   No preference indicated   Learning Readiness:   Ready  MEDICATIONS: reviewed   DIETARY INTAKE:  24-hr recall:  B ( AM): cereal  Snk ( AM): fruit  L ( PM): left overs OR 1x week [employer] OR [employer] Snk ( PM): yogurt OR sweet D ( PM): pizza OR soup, sandwich, OR burgers, vegetable, beans or mac & cheese Snk ( PM): sweet (ice cream bar) tries to just have tea, but usually has something like animal crackers Beverages: water  Usual physical activity: 5x/week, cardio or yoga 30-40 min  Estimated energy needs: 1800 calories  Progress Towards Goal(s):  New goal.   Nutritional Diagnosis:  NI-5.8.2 Excessive carbohydrate intake As related to snacking on sweets .  As evidenced by dietary recall.    Intervention:  Nutrition Education. Discussed non-hunger eating and the [place] non-hunger eating course. Discussed the AHA fish intake recommendation. Discussed Mediterranean diet. Discussed quick and easy balanced dinner ideas. Discussed mindful eating.  Teaching Method Utilized:  Visual Auditory  Handouts given during visit include:  My Plate Planner  Barriers to learning/adherence to lifestyle change: none  Demonstrated degree of  understanding via:  Teach Back   Monitoring/Evaluation:  Dietary intake, exercise, and body weight prn.

## 2018-02-10 NOTE — Patient Instructions (Signed)
Continue to eat balanced meals Consider adding more vegetables to your lunch Consider investigating why you are doing non-hunger eating Continue to have regular exercise

## 2018-07-19 ENCOUNTER — Encounter: Payer: Self-pay | Admitting: Physician Assistant

## 2018-07-19 DIAGNOSIS — F411 Generalized anxiety disorder: Secondary | ICD-10-CM

## 2018-07-19 DIAGNOSIS — F331 Major depressive disorder, recurrent, moderate: Secondary | ICD-10-CM

## 2018-07-20 ENCOUNTER — Other Ambulatory Visit: Payer: Self-pay

## 2018-07-20 MED ORDER — FLUOXETINE HCL 40 MG PO CAPS: 40.0000 mg | ORAL_CAPSULE | Freq: Every day | ORAL | 0 refills | Status: DC

## 2018-07-20 MED ORDER — BUPROPION HCL ER (SR) 150 MG PO TB12: 150.0000 mg | ORAL_TABLET | Freq: Every day | ORAL | 0 refills | Status: DC

## 2018-07-20 MED ORDER — NORETHIN ACE-ETH ESTRAD-FE 1.5-30 MG-MCG PO TABS: 1.0000 | ORAL_TABLET | Freq: Every day | ORAL | 11 refills | Status: DC

## 2018-07-20 MED FILL — LARIN FE 1.5-30 TABLET: 1.5-30 | 28 days supply | Qty: 28 | Fill #0

## 2018-07-21 MED ORDER — NORETHIN ACE-ETH ESTRAD-FE 1.5-30 MG-MCG PO TABS: 1.0000 | ORAL_TABLET | Freq: Every day | ORAL | 3 refills | Status: DC

## 2018-07-21 MED ORDER — BUPROPION HCL ER (SR) 150 MG PO TB12: 150.0000 mg | ORAL_TABLET | Freq: Every day | ORAL | 0 refills | Status: DC

## 2018-07-21 MED ORDER — FLUOXETINE HCL 40 MG PO CAPS: 40.0000 mg | ORAL_CAPSULE | Freq: Every day | ORAL | 0 refills | Status: DC

## 2018-07-21 MED FILL — BUPROPION SR 150 MG TABLET: 150 | 30 days supply | Qty: 30 | Fill #0

## 2018-07-21 MED FILL — FLUoxetine HCL 40 MG CAPS: 40 | 30 days supply | Qty: 30 | Fill #0

## 2018-07-21 NOTE — Addendum Note (Signed)
Addended by: Sandrea MatteERSKINE, Elsa Ploch R on: 07/21/2018 08:36 AM   Modules accepted: Orders

## 2018-07-23 ENCOUNTER — Ambulatory Visit (INDEPENDENT_AMBULATORY_CARE_PROVIDER_SITE_OTHER): Payer: BLUE CROSS/BLUE SHIELD | Admitting: Physician Assistant

## 2018-07-23 ENCOUNTER — Encounter: Payer: Self-pay | Admitting: Physician Assistant

## 2018-07-23 VITALS — BP 121/85 | HR 82 | Ht 63.25 in | Wt 174.0 lb

## 2018-07-23 DIAGNOSIS — Z131 Encounter for screening for diabetes mellitus: Secondary | ICD-10-CM | POA: Diagnosis not present

## 2018-07-23 DIAGNOSIS — F411 Generalized anxiety disorder: Secondary | ICD-10-CM

## 2018-07-23 DIAGNOSIS — Z789 Other specified health status: Secondary | ICD-10-CM

## 2018-07-23 DIAGNOSIS — Z13220 Encounter for screening for lipoid disorders: Secondary | ICD-10-CM

## 2018-07-23 DIAGNOSIS — E6609 Other obesity due to excess calories: Secondary | ICD-10-CM

## 2018-07-23 DIAGNOSIS — F331 Major depressive disorder, recurrent, moderate: Secondary | ICD-10-CM | POA: Diagnosis not present

## 2018-07-23 DIAGNOSIS — Z6830 Body mass index (BMI) 30.0-30.9, adult: Secondary | ICD-10-CM

## 2018-07-23 MED ORDER — FLUOXETINE HCL 40 MG PO CAPS: 40.0000 mg | ORAL_CAPSULE | Freq: Every day | ORAL | 3 refills | Status: DC

## 2018-07-23 MED ORDER — NORETHIN ACE-ETH ESTRAD-FE 1.5-30 MG-MCG PO TABS: 1.0000 | ORAL_TABLET | Freq: Every day | ORAL | 3 refills | Status: DC

## 2018-07-23 MED ORDER — BUPROPION HCL ER (SR) 150 MG PO TB12: 150.0000 mg | ORAL_TABLET | Freq: Two times a day (BID) | ORAL | 3 refills | Status: DC

## 2018-07-23 NOTE — Patient Instructions (Signed)

## 2018-07-23 NOTE — Progress Notes (Signed)
Subjective:    Patient ID: Adriana Frazier, female    DOB: 1981-08-03, 37 y.o.   MRN: [id number]  HPI Pt is a 37 yo female with ulcerative colitis, HLD, MDD, GAD who presents to the clinic for medication refill and 6 month follow up.   MDD/GAD- currently she is doing well. She had some worsening with her mood when there were some questions about her childrens health and development. Those have resolved and she is feeling better. She continues to go to counseling intermittently. No SI/HC.   Needs refill on OCP. Doing well.   .. Active Ambulatory Problems    Diagnosis Date Noted  . Depression 08/04/2014  . Generalized anxiety disorder 08/04/2014  . Ulcerative colitis (HCC) 08/04/2014  . Excess or deficiency of vitamin D 11/02/2013  . Hyperlipidemia 06/23/2016  . Crohn's ileocolitis (HCC) 08/29/2016   Resolved Ambulatory Problems    Diagnosis Date Noted  . Mass of left side of neck 08/04/2014  . AMA (advanced maternal age) multigravida 35+ 05/25/2015  . Supervision of other high risk pregnancies, first trimester 06/25/2015  . Low lying placenta with hemorrhage, antepartum 08/11/2015  . Ultrasound recheck of fetal pyelectasis, antepartum 11/25/2015  . Preterm premature rupture of membranes (PPROM) with onset of labor within 24 hours of rupture in third trimester, antepartum 12/12/2015  . SVD (spontaneous vaginal delivery) 12/12/2015  . Tick bite 01/01/2016  . Ringworm 01/31/2018   Past Medical History:  Diagnosis Date  . Anxiety       Review of Systems  All other systems reviewed and are negative.      Objective:   Physical Exam  Constitutional: She is oriented to person, place, and time. She appears well-developed and well-nourished.  HENT:  Head: Normocephalic and atraumatic.  Cardiovascular: Normal rate and regular rhythm.  Pulmonary/Chest: Effort normal and breath sounds normal.  Neurological: She is alert and oriented to person, place, and time.  Skin: No rash  noted.  Psychiatric: She has a normal mood and affect. Her behavior is normal.          Assessment & Plan:   Marland KitchenMarland KitchenAtleigh was seen today for follow-up.  Diagnoses and all orders for this visit:  Moderate episode of recurrent major depressive disorder (HCC) -     FLUoxetine (PROZAC) 40 MG capsule; Take 1 capsule (40 mg total) by mouth daily. -     buPROPion (WELLBUTRIN SR) 150 MG 12 hr tablet; Take 1 tablet (150 mg total) by mouth 2 (two) times daily. -     COMPLETE METABOLIC PANEL WITH GFR  Generalized anxiety disorder -     FLUoxetine (PROZAC) 40 MG capsule; Take 1 capsule (40 mg total) by mouth daily. -     buPROPion (WELLBUTRIN SR) 150 MG 12 hr tablet; Take 1 tablet (150 mg total) by mouth 2 (two) times daily. -     COMPLETE METABOLIC PANEL WITH GFR  Screening for lipid disorders -     Lipid Panel w/reflex Direct LDL  Screening for diabetes mellitus -     COMPLETE METABOLIC PANEL WITH GFR  Class 1 obesity due to excess calories without serious comorbidity with body mass index (BMI) of 30.0 to 30.9 in adult -     TSH  Uses birth control -     norethindrone-ethinyl estradiol-iron (MICROGESTIN FE,GILDESS FE,LOESTRIN FE) 1.5-30 MG-MCG tablet; Take 1 tablet by mouth daily.    .. Depression screen [facility] 2/9 07/23/2018 02/10/2018 01/29/2018 05/15/2017 08/06/2015  Decreased Interest 0 0 0 0 0  Down, Depressed, Hopeless 0 0 0 0 0  PHQ - 2 Score 0 0 0 0 0  Altered sleeping 0 - 0 0 -  Tired, decreased energy 1 - 0 1 -  Change in appetite 1 - 0 0 -  Feeling bad or failure about yourself  0 - 0 0 -  Trouble concentrating 0 - 0 0 -  Moving slowly or fidgety/restless 0 - 0 0 -  Suicidal thoughts 0 - 0 - -  PHQ-9 Score 2 - 0 1 -  Difficult doing work/chores Not difficult at all - Not difficult at all - -   .[name]. GAD 7 : Generalized Anxiety Score 07/23/2018 01/29/2018 08/06/2015  Nervous, Anxious, on Edge 0 1 0  Control/stop worrying 0 0 0  Worry too much - different things 0 0 0  Trouble  relaxing 1 0 0  Restless 0 0 0  Easily annoyed or irritable 0 1 1  Afraid - awful might happen 0 0 0  Total GAD 7 Score 1 2 1   Anxiety Difficulty Not difficult at all Not difficult at all Not difficult at all    Medications refilled today. Fasting labs ordered.  Continue with medication for GAD/MDD and going to counseling as needed.  Discussed weight as it has increased some from this year. Dicussed medication if she is interested. She is not right now. Encouraged exercise at least 150 minutes a week.   Pap up to date. Refilled OcP. Declined bimanuel.  Follow up in 6 months.

## 2018-07-25 ENCOUNTER — Encounter: Payer: Self-pay | Admitting: Physician Assistant

## 2018-07-25 DIAGNOSIS — E6609 Other obesity due to excess calories: Secondary | ICD-10-CM | POA: Insufficient documentation

## 2018-07-25 DIAGNOSIS — Z789 Other specified health status: Secondary | ICD-10-CM | POA: Insufficient documentation

## 2018-07-25 DIAGNOSIS — Z6830 Body mass index (BMI) 30.0-30.9, adult: Secondary | ICD-10-CM

## 2018-08-09 DIAGNOSIS — Z131 Encounter for screening for diabetes mellitus: Secondary | ICD-10-CM | POA: Diagnosis not present

## 2018-08-09 DIAGNOSIS — F411 Generalized anxiety disorder: Secondary | ICD-10-CM | POA: Diagnosis not present

## 2018-08-09 DIAGNOSIS — Z13220 Encounter for screening for lipoid disorders: Secondary | ICD-10-CM | POA: Diagnosis not present

## 2018-08-09 DIAGNOSIS — F331 Major depressive disorder, recurrent, moderate: Secondary | ICD-10-CM | POA: Diagnosis not present

## 2018-08-10 LAB — COMPLETE METABOLIC PANEL WITH GFR
AG Ratio: 1.3 (calc) (ref 1.0–2.5)
ALBUMIN MSPROF: 3.8 g/dL (ref 3.6–5.1)
ALKALINE PHOSPHATASE (APISO): 44 U/L (ref 33–115)
ALT: 23 U/L (ref 6–29)
AST: 19 U/L (ref 10–30)
BUN: 15 mg/dL (ref 7–25)
CO2: 26 mmol/L (ref 20–32)
Calcium: 9.2 mg/dL (ref 8.6–10.2)
Chloride: 104 mmol/L (ref 98–110)
Creat: 0.87 mg/dL (ref 0.50–1.10)
GFR, Est African American: 98 mL/min/{1.73_m2} (ref >=60)
GFR, Est Non African American: 85 mL/min/{1.73_m2} (ref >=60)
Globulin: 3 g/dL (calc) (ref 1.9–3.7)
Glucose, Bld: 93 mg/dL (ref 65–99)
Potassium: 4.2 mmol/L (ref 3.5–5.3)
Sodium: 139 mmol/L (ref 135–146)
Total Bilirubin: 0.6 mg/dL (ref 0.2–1.2)
Total Protein: 6.8 g/dL (ref 6.1–8.1)

## 2018-08-10 LAB — TSH: TSH: 1.1 m[IU]/L

## 2018-08-10 LAB — LIPID PANEL W/REFLEX DIRECT LDL
Cholesterol: 215 mg/dL — ABNORMAL HIGH (ref <200)
HDL: 59 mg/dL (ref >50)
LDL CHOLESTEROL (CALC): 135 mg/dL — AB
Non-HDL Cholesterol (Calc): 156 mg/dL (calc) — ABNORMAL HIGH (ref <130)
Total CHOL/HDL Ratio: 3.6 (calc) (ref <5.0)
Triglycerides: 106 mg/dL (ref <150)

## 2018-08-12 NOTE — Progress Notes (Signed)
Call pt: kidney, liver, glucose, thyroid perfect.  Cholesterol has improved. LDL much better. HDL still really good. Keep up the good work and will continue to monitor!

## 2018-10-23 ENCOUNTER — Other Ambulatory Visit: Payer: Self-pay | Admitting: Physician Assistant

## 2018-10-23 DIAGNOSIS — F331 Major depressive disorder, recurrent, moderate: Secondary | ICD-10-CM

## 2018-10-23 DIAGNOSIS — F411 Generalized anxiety disorder: Secondary | ICD-10-CM

## 2018-10-29 DIAGNOSIS — K5180 Other ulcerative colitis without complications: Secondary | ICD-10-CM | POA: Diagnosis not present

## 2019-01-17 ENCOUNTER — Encounter: Payer: Self-pay | Admitting: Physician Assistant

## 2019-01-17 ENCOUNTER — Ambulatory Visit (INDEPENDENT_AMBULATORY_CARE_PROVIDER_SITE_OTHER): Payer: BLUE CROSS/BLUE SHIELD | Admitting: Physician Assistant

## 2019-01-17 VITALS — BP 107/67 | HR 77 | Ht 63.25 in | Wt 174.0 lb

## 2019-01-17 DIAGNOSIS — H6123 Impacted cerumen, bilateral: Secondary | ICD-10-CM | POA: Diagnosis not present

## 2019-01-17 DIAGNOSIS — H938X1 Other specified disorders of right ear: Secondary | ICD-10-CM | POA: Diagnosis not present

## 2019-01-17 DIAGNOSIS — R29898 Other symptoms and signs involving the musculoskeletal system: Secondary | ICD-10-CM

## 2019-01-17 MED ORDER — CARBAMIDE PEROXIDE 6.5 % OT SOLN: 10.0000 [drp] | Freq: Two times a day (BID) | OTIC | 1 refills | Status: DC

## 2019-01-17 MED ORDER — CYCLOBENZAPRINE HCL 10 MG PO TABS: 10.0000 mg | ORAL_TABLET | Freq: Three times a day (TID) | ORAL | 0 refills | Status: DC | PRN

## 2019-01-17 NOTE — Progress Notes (Signed)
Subjective:    Patient ID: Adriana Frazier, female    DOB: 03-29-1981, 38 y.o.   MRN: [id number]  HPI  Pt is a 38 yo female who presents to the clinic with right ear pressure for the last week. She used some hydrogen peroxide on her ears without any benefit. No fever, chills, pain, headaches, sinus pressure, cough, SOB. She has hx of cerumen impaction She does feel like her hearing has decreased.  She mentions her neck feels "tight" a lot. Denies any injury. She wakes up with it sore. She does have young children at home.   .. Active Ambulatory Problems    Diagnosis Date Noted  . Depression 08/04/2014  . Generalized anxiety disorder 08/04/2014  . Ulcerative colitis (HCC) 08/04/2014  . Excess or deficiency of vitamin D 11/02/2013  . Hyperlipidemia 06/23/2016  . Crohn's ileocolitis (HCC) 08/29/2016  . Class 1 obesity due to excess calories without serious comorbidity with body mass index (BMI) of 30.0 to 30.9 in adult 07/25/2018  . Uses birth control 07/25/2018  . Neck tightness 01/18/2019   Resolved Ambulatory Problems    Diagnosis Date Noted  . Mass of left side of neck 08/04/2014  . AMA (advanced maternal age) multigravida 35+ 05/25/2015  . Supervision of other high risk pregnancies, first trimester 06/25/2015  . Low lying placenta with hemorrhage, antepartum 08/11/2015  . Ultrasound recheck of fetal pyelectasis, antepartum 11/25/2015  . Preterm premature rupture of membranes (PPROM) with onset of labor within 24 hours of rupture in third trimester, antepartum 12/12/2015  . SVD (spontaneous vaginal delivery) 12/12/2015  . Tick bite 01/01/2016  . Ringworm 01/31/2018   Past Medical History:  Diagnosis Date  . Anxiety       Review of Systems See HPI.     Objective:   Physical Exam Vitals signs reviewed.  Constitutional:      Appearance: Normal appearance.  HENT:     Head: Normocephalic and atraumatic.     Right Ear: Tympanic membrane normal. There is impacted cerumen.      Left Ear: Tympanic membrane normal. There is impacted cerumen.     Ears:     Comments: After irrigation. Right external canal erythematous. TM clear bilaterally.  Cardiovascular:     Rate and Rhythm: Normal rate and regular rhythm.     Pulses: Normal pulses.  Pulmonary:     Effort: Pulmonary effort is normal.  Neurological:     General: No focal deficit present.     Mental Status: She is alert and oriented to person, place, and time.  Psychiatric:        Mood and Affect: Mood normal.        Behavior: Behavior normal.           Assessment & Plan:  [name].[name].Adriana Frazier was seen today for ear pain.  Diagnoses and all orders for this visit:  Bilateral impacted cerumen -     carbamide peroxide (DEBROX) 6.5 % OTIC solution; Place 10 drops into both ears 2 (two) times daily. Dispense 1 bottle  Ear pressure, right  Neck tightness -     cyclobenzaprine (FLEXERIL) 10 MG tablet; Take 1 tablet (10 mg total) by mouth 3 (three) times daily as needed for muscle spasms.  [name]..Cerumen Removal Template: Indication: Cerumen impaction of the ear(s) Medical necessity statement: On physical examination, cerumen impairs clinically significant portions of the external auditory canal, and tympanic membrane. Noted obstructive, copious cerumen that cannot be removed without magnification and instrumentations requiring physician skills Consent:  Discussed benefits and risks of procedure and verbal consent obtained Procedure: Patient was prepped for the procedure. Utilized an otoscope to assess and take note of the ear canal, the tympanic membrane, and the presence, amount, and placement of the cerumen. Gentle water irrigation and soft plastic curette was utilized to remove cerumen.  Post procedure examination shows cerumen was completely removed. Patient tolerated procedure well. The patient is made aware that they may experience temporary vertigo, temporary hearing loss, and temporary discomfort. If these symptom  last for more than 24 hours to call the clinic or proceed to the ED.  Pt feels much better after irrigation. Hearing returned. Use debrox as needed.   Discussed neck pain. Consider conservation therapy with icy hot patches, massage, tens unit, flexeril at bedtime, NSAIDs as needed.

## 2019-01-18 DIAGNOSIS — R29898 Other symptoms and signs involving the musculoskeletal system: Secondary | ICD-10-CM | POA: Insufficient documentation

## 2019-05-27 DIAGNOSIS — Z1211 Encounter for screening for malignant neoplasm of colon: Secondary | ICD-10-CM | POA: Diagnosis not present

## 2019-05-27 DIAGNOSIS — K5190 Ulcerative colitis, unspecified, without complications: Secondary | ICD-10-CM | POA: Diagnosis not present

## 2019-05-27 DIAGNOSIS — K6389 Other specified diseases of intestine: Secondary | ICD-10-CM | POA: Diagnosis not present

## 2019-05-27 LAB — HM COLONOSCOPY

## 2019-05-31 ENCOUNTER — Ambulatory Visit (INDEPENDENT_AMBULATORY_CARE_PROVIDER_SITE_OTHER): Payer: BC Managed Care – PPO | Admitting: Physician Assistant

## 2019-05-31 ENCOUNTER — Encounter: Payer: Self-pay | Admitting: Physician Assistant

## 2019-05-31 ENCOUNTER — Ambulatory Visit: Payer: BLUE CROSS/BLUE SHIELD

## 2019-05-31 VITALS — Temp 98.5°F | Ht 63.25 in | Wt 160.0 lb

## 2019-05-31 DIAGNOSIS — R21 Rash and other nonspecific skin eruption: Secondary | ICD-10-CM

## 2019-05-31 DIAGNOSIS — R6883 Chills (without fever): Secondary | ICD-10-CM

## 2019-05-31 DIAGNOSIS — K649 Unspecified hemorrhoids: Secondary | ICD-10-CM

## 2019-05-31 DIAGNOSIS — R509 Fever, unspecified: Secondary | ICD-10-CM | POA: Diagnosis not present

## 2019-05-31 MED ORDER — HYDROCORTISONE (PERIANAL) 2.5 % EX CREA: 1.0000 "application " | TOPICAL_CREAM | Freq: Two times a day (BID) | CUTANEOUS | 0 refills | Status: DC

## 2019-05-31 NOTE — Progress Notes (Signed)
Patient ID: Adriana Frazier, female   DOB: [birth year], 38 y.o.   MRN: [id number] .Marland KitchenVirtual Visit via Video Note  I connected with Denetria Luevanos on 05/31/19 at  2:00 PM EDT by a video enabled telemedicine application and verified that I am speaking with the correct person using two identifiers.  Location: Patient: home Provider: clinic   I discussed the limitations of evaluation and management by telemedicine and the availability of in person appointments. The patient expressed understanding and agreed to proceed.  History of Present Illness:  Pt is a 38 yo female who calls into the clinic to discuss fever, chills, headache, and rash. Symptoms started 2 days ago with fever and chills. Her fever has improved but she feels very tired and now has a dull headache and aches all over. Her rash is more redness over chest, neck and upper back. It is a little itchy. She denies any new detergents or lotions. No SOB or cough. No GI symptoms. No loss of smell or taste. No direct covid contact. No sick contacts.    Pt had colonoscopy Friday and noticed hemorrhoids since. No bleeding with BM. They do irritate her. Not tried anything to make better.     .. Active Ambulatory Problems    Diagnosis Date Noted  . Depression 08/04/2014  . Generalized anxiety disorder 08/04/2014  . Ulcerative colitis (HCC) 08/04/2014  . Excess or deficiency of vitamin D 11/02/2013  . Hyperlipidemia 06/23/2016  . Crohn's ileocolitis (HCC) 08/29/2016  . Class 1 obesity due to excess calories without serious comorbidity with body mass index (BMI) of 30.0 to 30.9 in adult 07/25/2018  . Uses birth control 07/25/2018  . Neck tightness 01/18/2019  . Hemorrhoids 05/31/2019   Resolved Ambulatory Problems    Diagnosis Date Noted  . Mass of left side of neck 08/04/2014  . AMA (advanced maternal age) multigravida 35+ 05/25/2015  . Supervision of other high risk pregnancies, first trimester 06/25/2015  . Low lying placenta with  hemorrhage, antepartum 08/11/2015  . Ultrasound recheck of fetal pyelectasis, antepartum 11/25/2015  . Preterm premature rupture of membranes (PPROM) with onset of labor within 24 hours of rupture in third trimester, antepartum 12/12/2015  . SVD (spontaneous vaginal delivery) 12/12/2015  . Tick bite 01/01/2016  . Ringworm 01/31/2018   Past Medical History:  Diagnosis Date  . Anxiety    Reviewed med, allergy, problem list.     Observations/Objective: No acute distress.  No labored breathing or cough.  Erythematous appearance to chest, neck and upper back. Not able to clearly visualize and specific bumps.   .. Today's Vitals   05/31/19 1358  Temp: 98.5 F (36.9 C)  TempSrc: Oral  Weight: 160 lb (72.6 kg)  Height: 5' 3.25" (1.607 m)   Body mass index is 28.12 kg/m.    Assessment and Plan: Marland KitchenMarland KitchenDiagnoses and all orders for this visit:  Fever, unspecified fever cause  Chills  Rash  Hemorrhoids, unspecified hemorrhoid type -     hydrocortisone (ANUSOL-HC) 2.5 % rectal cream; Place 1 application rectally 2 (two) times daily. For hemorrhoids   Pt needs to be covid tested. Discussed self isolating until test results come back. Rash could be more a viral exanthem. Does not appear to be contact dermatitis. She could take benadryl as needed for itching. anusol given for hemorrhoids. Encouraged sitz baths. Keep hydrated. Tylenol/ibuprofen for fever and body aches. If breathing worsens go to ED. Follow up as needed.    Follow Up Instructions:    I discussed  the assessment and treatment plan with the patient. The patient was provided an opportunity to ask questions and all were answered. The patient agreed with the plan and demonstrated an understanding of the instructions.   The patient was advised to call back or seek an in-person evaluation if the symptoms worsen or if the condition fails to improve as anticipated.     Iran Planas, PA-C

## 2019-05-31 NOTE — Progress Notes (Deleted)
Fever and chills 2 days ago - 100.4   Now: HA, body aches, rash - torso (front/back, itches when touched, no pain)  No cough, SOB  Just had colonoscopy Friday and now having thrombosed hemorrhoids, no bleeding with BM

## 2019-06-01 ENCOUNTER — Other Ambulatory Visit: Payer: Self-pay

## 2019-06-01 DIAGNOSIS — Z20822 Contact with and (suspected) exposure to covid-19: Secondary | ICD-10-CM

## 2019-06-01 DIAGNOSIS — Z20828 Contact with and (suspected) exposure to other viral communicable diseases: Secondary | ICD-10-CM | POA: Diagnosis not present

## 2019-06-03 LAB — NOVEL CORONAVIRUS, NAA: SARS-CoV-2, NAA: NOT DETECTED

## 2019-06-22 ENCOUNTER — Encounter: Payer: Self-pay | Admitting: Physician Assistant

## 2019-07-12 ENCOUNTER — Other Ambulatory Visit: Payer: Self-pay | Admitting: Physician Assistant

## 2019-07-12 DIAGNOSIS — Z789 Other specified health status: Secondary | ICD-10-CM

## 2019-08-08 ENCOUNTER — Other Ambulatory Visit: Payer: Self-pay | Admitting: Neurology

## 2019-08-08 DIAGNOSIS — Z789 Other specified health status: Secondary | ICD-10-CM

## 2019-08-08 MED ORDER — NORETHIN ACE-ETH ESTRAD-FE 1.5-30 MG-MCG PO TABS: 1.0000 | ORAL_TABLET | Freq: Every day | ORAL | 1 refills | Status: DC

## 2019-08-13 ENCOUNTER — Other Ambulatory Visit: Payer: Self-pay | Admitting: Physician Assistant

## 2019-08-13 DIAGNOSIS — F331 Major depressive disorder, recurrent, moderate: Secondary | ICD-10-CM

## 2019-08-13 DIAGNOSIS — F411 Generalized anxiety disorder: Secondary | ICD-10-CM

## 2019-08-16 ENCOUNTER — Other Ambulatory Visit: Payer: Self-pay | Admitting: Neurology

## 2019-08-16 DIAGNOSIS — F331 Major depressive disorder, recurrent, moderate: Secondary | ICD-10-CM

## 2019-08-16 DIAGNOSIS — F411 Generalized anxiety disorder: Secondary | ICD-10-CM

## 2019-08-16 MED ORDER — FLUOXETINE HCL 40 MG PO CAPS: ORAL_CAPSULE | ORAL | 0 refills | Status: DC

## 2019-09-12 ENCOUNTER — Other Ambulatory Visit: Payer: Self-pay | Admitting: Neurology

## 2019-09-12 DIAGNOSIS — F331 Major depressive disorder, recurrent, moderate: Secondary | ICD-10-CM

## 2019-09-12 DIAGNOSIS — F411 Generalized anxiety disorder: Secondary | ICD-10-CM

## 2019-09-12 MED ORDER — BUPROPION HCL ER (SR) 150 MG PO TB12: 150.0000 mg | ORAL_TABLET | Freq: Two times a day (BID) | ORAL | 0 refills | Status: DC

## 2019-09-19 ENCOUNTER — Ambulatory Visit (INDEPENDENT_AMBULATORY_CARE_PROVIDER_SITE_OTHER)
Admission: RE | Admit: 2019-09-19 | Discharge: 2019-09-19 | Disposition: A | Discharge status: Home / Self Care | Payer: BC Managed Care – PPO | Source: Ambulatory Visit

## 2019-09-19 ENCOUNTER — Other Ambulatory Visit: Payer: Self-pay

## 2019-09-19 ENCOUNTER — Encounter: Payer: Self-pay | Admitting: Family Medicine

## 2019-09-19 ENCOUNTER — Emergency Department
Admission: EM | Admit: 2019-09-19 | Discharge: 2019-09-19 | Disposition: A | Discharge status: Home / Self Care | Payer: BC Managed Care – PPO | Source: Home / Self Care

## 2019-09-19 DIAGNOSIS — R519 Headache, unspecified: Secondary | ICD-10-CM

## 2019-09-19 DIAGNOSIS — R52 Pain, unspecified: Secondary | ICD-10-CM | POA: Diagnosis not present

## 2019-09-19 DIAGNOSIS — M791 Myalgia, unspecified site: Secondary | ICD-10-CM | POA: Diagnosis not present

## 2019-09-19 DIAGNOSIS — R0981 Nasal congestion: Secondary | ICD-10-CM | POA: Diagnosis not present

## 2019-09-19 DIAGNOSIS — Z20822 Contact with and (suspected) exposure to covid-19: Secondary | ICD-10-CM | POA: Diagnosis not present

## 2019-09-19 NOTE — ED Provider Notes (Signed)
Virtual Visit via Video Note:  Adriana Frazier  initiated request for Telemedicine visit with [facility] team. I connected with Adriana Frazier  on 09/19/2019 at 2:37 PM  for a synchronized telemedicine visit using a video enabled HIPPA compliant telemedicine application. I verified that I am speaking with Adriana Frazier  using two identifiers. Adriana Bail, NP  was physically located in a [facility] site and Adriana Frazier was located at a different location.   The limitations of evaluation and management by telemedicine as well as the availability of in-person appointments were discussed. Patient was informed that she  may incur a bill ( including co-pay) for this virtual visit encounter. Adriana Frazier  expressed understanding and gave verbal consent to proceed with virtual visit.     History of Present Illness:Adriana Frazier  is a 39 y.o. female presents for evaluation of body aches, headache, congestion, loss of smell, and fatigue x 4 days.  She denies fever, chills, sore throat, cough, shortness of breath, vomiting, diarrhea, rash, or other symptoms.  She denies pregnancy or breastfeeding.  No recent COVID testing.     Allergies  Allergen Reactions  . Shellfish Allergy Nausea And Vomiting     Past Medical History:  Diagnosis Date  . Anxiety   . Depression   . Ulcerative colitis (HCC)      Social History   Tobacco Use  . Smoking status: Never Smoker  . Smokeless tobacco: Never Used  Substance Use Topics  . Alcohol use: Yes    Alcohol/week: 0.0 standard drinks    Comment: rarely.   . Drug use: No        Observations/Objective: Physical Exam  VITALS: Patient denies fever. GENERAL: Alert, appears well and in no acute distress. HEENT: Atraumatic. NECK: Normal movements of the head and neck. CARDIOPULMONARY: No increased WOB. Speaking in clear sentences. I:E ratio WNL.  MS: Moves all visible extremities without noticeable abnormality. PSYCH: Pleasant and  cooperative, well-groomed. Speech normal rate and rhythm. Affect is appropriate. Insight and judgement are appropriate. Attention is focused, linear, and appropriate.  NEURO: CN grossly intact. Oriented as arrived to appointment on time with no prompting. Moves both UE equally.  SKIN: No obvious lesions, wounds, erythema, or cyanosis noted on face or hands.   Assessment and Plan:    ICD-10-CM   1. Suspected COVID-19 virus infection  Z20.822        Follow Up Instructions: Instructed patient to come to the urgent care to receive a COVID test.  Instructed her to self quarantine until her test result is back.  Instructed her to take Tylenol as needed for fever or discomfort.  Discussed that she should rest and stay hydrated.  Instructed her to go to the emergency department if she has high fever not relieved by Tylenol, shortness of breath, severe vomiting or diarrhea, or other concerning symptoms.  Patient agrees to plan of care.      I discussed the assessment and treatment plan with the patient. The patient was provided an opportunity to ask questions and all were answered. The patient agreed with the plan and demonstrated an understanding of the instructions.   The patient was advised to call back or seek an in-person evaluation if the symptoms worsen or if the condition fails to improve as anticipated.      Adriana Bail, NP  09/19/2019 2:37 PM         Adriana Bail, NP 09/19/19 1438

## 2019-09-19 NOTE — Discharge Instructions (Addendum)
Come to Urgent Care to receive a COVID test.    You should self quarantine until your test result is back and is negative.    Take Tylenol as needed for fever or discomfort.  Rest and keep yourself hydrated.    Go to the emergency department if you develop high fever, shortness of breath, severe diarrhea, or other concerning symptoms.

## 2019-09-19 NOTE — ED Triage Notes (Signed)
Patient here following Tele-Doc visit; past 4 days very fatigued, aching and loss of smell; no fever; no known exposure to covid positive person. Has had influenza vacc this season.

## 2019-09-20 ENCOUNTER — Telehealth (HOSPITAL_COMMUNITY): Payer: Self-pay | Admitting: Emergency Medicine

## 2019-09-20 LAB — NOVEL CORONAVIRUS, NAA: SARS-CoV-2, NAA: DETECTED — AB

## 2019-09-20 NOTE — Telephone Encounter (Signed)

## 2019-09-21 ENCOUNTER — Telehealth: Payer: Self-pay | Admitting: Internal Medicine

## 2019-09-21 NOTE — Telephone Encounter (Signed)
Called to discuss with Adriana Frazier about Covid symptoms and the use of bamlanivimab, a monoclonal antibody infusion for those with mild to moderate Covid symptoms and at a high risk of hospitalization.      Pt is not qualified for this infusion due to lack of identified risk factors and co-morbid conditions.  Symptoms reviewed as well as criteria for ending isolation.  Symptoms reviewed that would warrant ED/Hospital evaluation as well should her condition worsen. Preventative practices reviewed. Patient verbalized understanding.   Patient Active Problem List   Diagnosis Date Noted  . Hemorrhoids 05/31/2019  . Neck tightness 01/18/2019  . Class 1 obesity due to excess calories without serious comorbidity with body mass index (BMI) of 30.0 to 30.9 in adult 07/25/2018  . Uses birth control 07/25/2018  . Crohn's ileocolitis (HCC) 08/29/2016  . Hyperlipidemia 06/23/2016  . Depression 08/04/2014  . Generalized anxiety disorder 08/04/2014  . Ulcerative colitis (HCC) 08/04/2014  . Excess or deficiency of vitamin D 11/02/2013   Cyndee Brightly, NP-C [facility]  pgr [phone]

## 2019-11-26 ENCOUNTER — Other Ambulatory Visit: Payer: Self-pay | Admitting: Physician Assistant

## 2019-11-26 DIAGNOSIS — F411 Generalized anxiety disorder: Secondary | ICD-10-CM

## 2019-11-26 DIAGNOSIS — F331 Major depressive disorder, recurrent, moderate: Secondary | ICD-10-CM

## 2019-12-20 DIAGNOSIS — Z800 Family history of malignant neoplasm of digestive organs: Secondary | ICD-10-CM | POA: Diagnosis not present

## 2019-12-20 DIAGNOSIS — K5180 Other ulcerative colitis without complications: Secondary | ICD-10-CM | POA: Diagnosis not present

## 2020-01-01 ENCOUNTER — Other Ambulatory Visit: Payer: Self-pay | Admitting: Physician Assistant

## 2020-01-01 DIAGNOSIS — Z789 Other specified health status: Secondary | ICD-10-CM

## 2020-01-02 ENCOUNTER — Telehealth (INDEPENDENT_AMBULATORY_CARE_PROVIDER_SITE_OTHER): Payer: BC Managed Care – PPO | Admitting: Physician Assistant

## 2020-01-02 ENCOUNTER — Encounter: Payer: Self-pay | Admitting: Physician Assistant

## 2020-01-02 DIAGNOSIS — F411 Generalized anxiety disorder: Secondary | ICD-10-CM | POA: Diagnosis not present

## 2020-01-02 DIAGNOSIS — Z789 Other specified health status: Secondary | ICD-10-CM | POA: Diagnosis not present

## 2020-01-02 DIAGNOSIS — F331 Major depressive disorder, recurrent, moderate: Secondary | ICD-10-CM

## 2020-01-02 MED ORDER — NORETHIN ACE-ETH ESTRAD-FE 1.5-30 MG-MCG PO TABS: 1.0000 | ORAL_TABLET | Freq: Every day | ORAL | 1 refills | Status: DC

## 2020-01-02 MED ORDER — FLUOXETINE HCL 40 MG PO CAPS: ORAL_CAPSULE | ORAL | 1 refills | Status: DC

## 2020-01-02 MED ORDER — BUPROPION HCL ER (SR) 150 MG PO TB12: 150.0000 mg | ORAL_TABLET | Freq: Two times a day (BID) | ORAL | 1 refills | Status: DC

## 2020-01-02 NOTE — Progress Notes (Signed)
Patient needing refills.  PHQ9-GAD7 completed.  No other issues.

## 2020-01-02 NOTE — Progress Notes (Signed)
Patient ID: Adriana Frazier, female   DOB: 1981-07-22, 39 y.o.   MRN: [id number] .Marland KitchenVirtual Visit via Video Note  I connected with Alixandra Alfieri on 01/02/20 at  1:00 PM EDT by a video enabled telemedicine application and verified that I am speaking with the correct person using two identifiers.  Location: Patient: home Provider: home   I discussed the limitations of evaluation and management by telemedicine and the availability of in person appointments. The patient expressed understanding and agreed to proceed.  History of Present Illness: Pt is a 39 yo female with MDD, anxiety who presents to the clinic for medication refills.   Pt is doing well. She continues to work from home. No concerns or complaints. No SI/HC.   She needs OCP. No concerns. Last pap 2016.    .. Active Ambulatory Problems    Diagnosis Date Noted  . Depression 08/04/2014  . Generalized anxiety disorder 08/04/2014  . Ulcerative colitis (HCC) 08/04/2014  . Excess or deficiency of vitamin D 11/02/2013  . Hyperlipidemia 06/23/2016  . Crohn's ileocolitis (HCC) 08/29/2016  . Class 1 obesity due to excess calories without serious comorbidity with body mass index (BMI) of 30.0 to 30.9 in adult 07/25/2018  . Uses birth control 07/25/2018  . Neck tightness 01/18/2019  . Hemorrhoids 05/31/2019   Resolved Ambulatory Problems    Diagnosis Date Noted  . Mass of left side of neck 08/04/2014  . AMA (advanced maternal age) multigravida 35+ 05/25/2015  . Supervision of other high risk pregnancies, first trimester 06/25/2015  . Low lying placenta with hemorrhage, antepartum 08/11/2015  . Ultrasound recheck of fetal pyelectasis, antepartum 11/25/2015  . Preterm premature rupture of membranes (PPROM) with onset of labor within 24 hours of rupture in third trimester, antepartum 12/12/2015  . SVD (spontaneous vaginal delivery) 12/12/2015  . Tick bite 01/01/2016  . Ringworm 01/31/2018   Past Medical History:  Diagnosis Date  .  Anxiety    Reviewed med, allergy, problem list.   Observations/Objective: No acute distress.  Normal mood and appearance.   .. Today's Vitals   01/02/20 1114  Weight: 160 lb (72.6 kg)  Height: 5\' 3"  (1.6 m)   Body mass index is 28.34 kg/m.   .. Depression screen [facility] 2/9 01/02/2020 08/06/2015  Decreased Interest 0 0  Down, Depressed, Hopeless 0 0  PHQ - 2 Score 0 0  Altered sleeping 0 -  Tired, decreased energy 0 -  Change in appetite 0 -  Feeling bad or failure about yourself  0 -  Trouble concentrating 0 -  Moving slowly or fidgety/restless 0 -  Suicidal thoughts 0 -  PHQ-9 Score 0 -  Difficult doing work/chores Not difficult at all -  Some encounter information is confidential and restricted. Go to Review Flowsheets activity to see all data.   .. GAD 7 : Generalized Anxiety Score 01/02/2020 08/06/2015  Nervous, Anxious, on Edge 1 0  Control/stop worrying 0 0  Worry too much - different things 0 0  Trouble relaxing 0 0  Restless 0 0  Easily annoyed or irritable 1 1  Afraid - awful might happen 0 0  Total GAD 7 Score 2 1  Anxiety Difficulty Somewhat difficult Not difficult at all  Some encounter information is confidential and restricted. Go to Review Flowsheets activity to see all data.     Assessment and Plan: 12/21/2016Marland KitchenMaguire was seen today for follow-up.  Diagnoses and all orders for this visit:  Moderate episode of recurrent major depressive disorder (HCC) -  FLUoxetine (PROZAC) 40 MG capsule; Take 1 capsule by mouth daily. -     buPROPion (WELLBUTRIN SR) 150 MG 12 hr tablet; Take 1 tablet (150 mg total) by mouth 2 (two) times daily.  Generalized anxiety disorder -     FLUoxetine (PROZAC) 40 MG capsule; Take 1 capsule by mouth daily. -     buPROPion (WELLBUTRIN SR) 150 MG 12 hr tablet; Take 1 tablet (150 mg total) by mouth 2 (two) times daily.  Uses birth control -     norethindrone-ethinyl estradiol-iron (BLISOVI FE 1.5/30) 1.5-30 MG-MCG tablet; Take 1  tablet by mouth daily.   Needs pap will get this year. 6 month refilled.  Declined STD screening.  PHQ/GAD numbers stable refilled prozac and wellbutrin.  Follow up in 6 months.    Follow Up Instructions:    I discussed the assessment and treatment plan with the patient. The patient was provided an opportunity to ask questions and all were answered. The patient agreed with the plan and demonstrated an understanding of the instructions.   The patient was advised to call back or seek an in-person evaluation if the symptoms worsen or if the condition fails to improve as anticipated.  I provided 11 minutes of non-face-to-face time during this encounter.   Iran Planas, PA-C

## 2020-03-12 ENCOUNTER — Other Ambulatory Visit: Payer: Self-pay | Admitting: Physician Assistant

## 2020-03-12 DIAGNOSIS — F411 Generalized anxiety disorder: Secondary | ICD-10-CM

## 2020-03-12 DIAGNOSIS — F331 Major depressive disorder, recurrent, moderate: Secondary | ICD-10-CM

## 2020-04-12 DIAGNOSIS — F4010 Social phobia, unspecified: Secondary | ICD-10-CM | POA: Diagnosis not present

## 2020-04-26 DIAGNOSIS — F4010 Social phobia, unspecified: Secondary | ICD-10-CM | POA: Diagnosis not present

## 2020-05-02 DIAGNOSIS — F4010 Social phobia, unspecified: Secondary | ICD-10-CM | POA: Diagnosis not present

## 2020-05-10 DIAGNOSIS — F4010 Social phobia, unspecified: Secondary | ICD-10-CM | POA: Diagnosis not present

## 2020-05-24 DIAGNOSIS — F4010 Social phobia, unspecified: Secondary | ICD-10-CM | POA: Diagnosis not present

## 2020-06-11 DIAGNOSIS — F4010 Social phobia, unspecified: Secondary | ICD-10-CM | POA: Diagnosis not present

## 2020-06-17 ENCOUNTER — Other Ambulatory Visit: Payer: Self-pay | Admitting: Physician Assistant

## 2020-06-17 DIAGNOSIS — F411 Generalized anxiety disorder: Secondary | ICD-10-CM

## 2020-06-17 DIAGNOSIS — F331 Major depressive disorder, recurrent, moderate: Secondary | ICD-10-CM

## 2020-06-21 DIAGNOSIS — F4010 Social phobia, unspecified: Secondary | ICD-10-CM | POA: Diagnosis not present

## 2020-06-22 ENCOUNTER — Other Ambulatory Visit: Payer: Self-pay | Admitting: *Deleted

## 2020-06-22 DIAGNOSIS — Z789 Other specified health status: Secondary | ICD-10-CM

## 2020-06-22 MED ORDER — NORETHIN ACE-ETH ESTRAD-FE 1.5-30 MG-MCG PO TABS: 1.0000 | ORAL_TABLET | Freq: Every day | ORAL | 1 refills | Status: DC

## 2020-06-27 ENCOUNTER — Telehealth: Payer: Self-pay | Admitting: Physician Assistant

## 2020-06-27 ENCOUNTER — Other Ambulatory Visit: Payer: Self-pay | Admitting: Physician Assistant

## 2020-06-27 NOTE — Telephone Encounter (Signed)
Remind patient she needs pap smear.

## 2020-06-27 NOTE — Telephone Encounter (Signed)
Mychart message sent to patient.

## 2020-06-28 DIAGNOSIS — Z20822 Contact with and (suspected) exposure to covid-19: Secondary | ICD-10-CM | POA: Diagnosis not present

## 2020-07-04 ENCOUNTER — Ambulatory Visit (INDEPENDENT_AMBULATORY_CARE_PROVIDER_SITE_OTHER): Payer: BC Managed Care – PPO | Admitting: Physician Assistant

## 2020-07-04 ENCOUNTER — Other Ambulatory Visit: Payer: Self-pay

## 2020-07-04 ENCOUNTER — Other Ambulatory Visit (HOSPITAL_COMMUNITY)
Admission: RE | Admit: 2020-07-04 | Discharge: 2020-07-04 | Disposition: A | Discharge status: Home / Self Care | Payer: BC Managed Care – PPO | Source: Ambulatory Visit | Attending: Physician Assistant | Admitting: Physician Assistant

## 2020-07-04 ENCOUNTER — Encounter: Payer: Self-pay | Admitting: Physician Assistant

## 2020-07-04 VITALS — BP 107/64 | HR 84 | Ht 63.0 in | Wt 161.0 lb

## 2020-07-04 DIAGNOSIS — Z1231 Encounter for screening mammogram for malignant neoplasm of breast: Secondary | ICD-10-CM

## 2020-07-04 DIAGNOSIS — Z131 Encounter for screening for diabetes mellitus: Secondary | ICD-10-CM

## 2020-07-04 DIAGNOSIS — F331 Major depressive disorder, recurrent, moderate: Secondary | ICD-10-CM

## 2020-07-04 DIAGNOSIS — F411 Generalized anxiety disorder: Secondary | ICD-10-CM

## 2020-07-04 DIAGNOSIS — Z124 Encounter for screening for malignant neoplasm of cervix: Secondary | ICD-10-CM

## 2020-07-04 DIAGNOSIS — Z0000 Encounter for general adult medical examination without abnormal findings: Secondary | ICD-10-CM

## 2020-07-04 DIAGNOSIS — Z13220 Encounter for screening for lipoid disorders: Secondary | ICD-10-CM | POA: Diagnosis not present

## 2020-07-04 DIAGNOSIS — Z23 Encounter for immunization: Secondary | ICD-10-CM

## 2020-07-04 MED ORDER — BUPROPION HCL ER (SR) 150 MG PO TB12: 150.0000 mg | ORAL_TABLET | Freq: Two times a day (BID) | ORAL | 1 refills | Status: DC

## 2020-07-04 MED ORDER — FLUOXETINE HCL 20 MG PO CAPS: 20.0000 mg | ORAL_CAPSULE | Freq: Every day | ORAL | 0 refills | Status: DC

## 2020-07-04 NOTE — Progress Notes (Signed)
Subjective:     Adriana Frazier is a 39 y.o. female and is here for a comprehensive physical exam. The patient reports problems - she has noticed she feels more "numb" and "apathetic" she wonders if she needs a medcation change. .  Social History   Socioeconomic History  . Marital status: Married    Spouse name: Not on file  . Number of children: 1   . Years of education: Not on file  . Highest education level: Not on file  Occupational History  . Occupation: therapist   Tobacco Use  . Smoking status: Never Smoker  . Smokeless tobacco: Never Used  Substance and Sexual Activity  . Alcohol use: Yes    Alcohol/week: 0.0 standard drinks    Comment: rarely.   . Drug use: No  . Sexual activity: Yes    Birth control/protection: None, Pill  Other Topics Concern  . Not on file  Social History Narrative   Works out about 5 days per week.  No regular caffeine.    Social Determinants of Health   Financial Resource Strain:   . Difficulty of Paying Living Expenses: Not on file  Food Insecurity:   . Worried About Programme researcher, broadcasting/film/video in the Last Year: Not on file  . Ran Out of Food in the Last Year: Not on file  Transportation Needs:   . Lack of Transportation (Medical): Not on file  . Lack of Transportation (Non-Medical): Not on file  Physical Activity:   . Days of Exercise per Week: Not on file  . Minutes of Exercise per Session: Not on file  Stress:   . Feeling of Stress : Not on file  Social Connections:   . Frequency of Communication with Friends and Family: Not on file  . Frequency of Social Gatherings with Friends and Family: Not on file  . Attends Religious Services: Not on file  . Active Member of Clubs or Organizations: Not on file  . Attends Banker Meetings: Not on file  . Marital Status: Not on file  Intimate Partner Violence:   . Fear of Current or Ex-Partner: Not on file  . Emotionally Abused: Not on file  . Physically Abused: Not on file  . Sexually  Abused: Not on file   Health Maintenance  Topic Date Due  . Hepatitis C Screening  Never done  . PAP SMEAR-Modifier  02/07/2020  . TETANUS/TDAP  10/14/2025  . INFLUENZA VACCINE  Completed  . COVID-19 Vaccine  Completed  . HIV Screening  Completed    The following portions of the patient's history were reviewed and updated as appropriate: allergies, current medications, past family history, past medical history, past social history, past surgical history and problem list.  Review of Systems Pertinent items noted in HPI and remainder of comprehensive ROS otherwise negative.   Objective:    BP 107/64   Pulse 84   Ht 5\' 3"  (1.6 m)   Wt 161 lb (73 kg)   SpO2 100%   BMI 28.52 kg/m  General appearance: alert, cooperative, appears stated age and mildly obese Head: Normocephalic, without obvious abnormality, atraumatic Eyes: conjunctivae/corneas clear. PERRL, EOM's intact. Fundi benign. Ears: normal TM's and external ear canals both ears Nose: Nares normal. Septum midline. Mucosa normal. No drainage or sinus tenderness. Throat: lips, mucosa, and tongue normal; teeth and gums normal Neck: no adenopathy, no carotid bruit, no JVD, supple, symmetrical, trachea midline and thyroid not enlarged, symmetric, no tenderness/mass/nodules Back: symmetric, no curvature. ROM  normal. No CVA tenderness. Lungs: clear to auscultation bilaterally Breasts: normal appearance, no masses or tenderness Heart: regular rate and rhythm, S1, S2 normal, no murmur, click, rub or gallop Abdomen: soft, non-tender; bowel sounds normal; no masses,  no organomegaly Pelvic: cervix normal in appearance, external genitalia normal, no adnexal masses or tenderness, no cervical motion tenderness, uterus normal size, shape, and consistency and vagina normal without discharge Extremities: extremities normal, atraumatic, no cyanosis or edema Pulses: 2+ and symmetric Skin: Skin color, texture, turgor normal. No rashes or  lesions Lymph nodes: Cervical, supraclavicular, and axillary nodes normal. Neurologic: Alert and oriented X 3, normal strength and tone. Normal symmetric reflexes. Normal coordination and gait    .[name] Depression screen [facility] 2/9 07/04/2020 01/02/2020 08/06/2015  Decreased Interest 0 0 0  Down, Depressed, Hopeless 0 0 0  PHQ - 2 Score 0 0 0  Altered sleeping 0 0 -  Tired, decreased energy 1 0 -  Change in appetite 0 0 -  Feeling bad or failure about yourself  0 0 -  Trouble concentrating 0 0 -  Moving slowly or fidgety/restless 0 0 -  Suicidal thoughts 0 0 -  PHQ-9 Score 1 0 -  Difficult doing work/chores - Not difficult at all -  Some encounter information is confidential and restricted. Go to Review Flowsheets activity to see all data.   .. GAD 7 : Generalized Anxiety Score 07/04/2020 01/02/2020 08/06/2015  Nervous, Anxious, on Edge 0 1 0  Control/stop worrying 0 0 0  Worry too much - different things 0 0 0  Trouble relaxing 0 0 0  Restless 0 0 0  Easily annoyed or irritable 0 1 1  Afraid - awful might happen 0 0 0  Total GAD 7 Score 0 2 1  Anxiety Difficulty Not difficult at all Somewhat difficult Not difficult at all  Some encounter information is confidential and restricted. Go to Review Flowsheets activity to see all data.     Assessment:    Healthy female exam.      Plan:    Marland KitchenMarland KitchenAlondra was seen today for annual exam.  Diagnoses and all orders for this visit:  Routine physical examination -     CBC with Differential/Platelet -     COMPLETE METABOLIC PANEL WITH GFR -     Lipid Panel w/reflex Direct LDL -     TSH  Screening for diabetes mellitus -     COMPLETE METABOLIC PANEL WITH GFR  Screening for lipid disorders -     Lipid Panel w/reflex Direct LDL  Papanicolaou smear -     Cytology - PAP  Flu vaccine need -     Flu Vaccine QUAD 36+ mos IM  Moderate episode of recurrent major depressive disorder (HCC) -     buPROPion (WELLBUTRIN SR) 150 MG 12 hr tablet;  Take 1 tablet (150 mg total) by mouth 2 (two) times daily. -     FLUoxetine (PROZAC) 20 MG capsule; Take 1 capsule (20 mg total) by mouth daily.  Generalized anxiety disorder -     buPROPion (WELLBUTRIN SR) 150 MG 12 hr tablet; Take 1 tablet (150 mg total) by mouth 2 (two) times daily. -     FLUoxetine (PROZAC) 20 MG capsule; Take 1 capsule (20 mg total) by mouth daily.  Visit for screening mammogram -     MM 3D SCREEN BREAST BILATERAL   .[name] Discussed 150 minutes of exercise a week.  Encouraged vitamin D 1000 units and Calcium 1300mg   or 4 servings of dairy a day.  Fasting labs ordered today.  Mammogram ordered.  Pap done today. Refilled OCP.  Flu/covid UTD.  Discussed medications. Decreased prozac to 20mg  for the next 4-6 weeks and see if helps her "feel" more.

## 2020-07-04 NOTE — Patient Instructions (Signed)
Health Maintenance, Female Adopting a healthy lifestyle and getting preventive care are important in promoting health and wellness. Ask your health care provider about:  The right schedule for you to have regular tests and exams.  Things you can do on your own to prevent diseases and keep yourself healthy. What should I know about diet, weight, and exercise? Eat a healthy diet   Eat a diet that includes plenty of vegetables, fruits, low-fat dairy products, and lean protein.  Do not eat a lot of foods that are high in solid fats, added sugars, or sodium. Maintain a healthy weight Body mass index (BMI) is used to identify weight problems. It estimates body fat based on height and weight. Your health care provider can help determine your BMI and help you achieve or maintain a healthy weight. Get regular exercise Get regular exercise. This is one of the most important things you can do for your health. Most adults should:  Exercise for at least 150 minutes each week. The exercise should increase your heart rate and make you sweat (moderate-intensity exercise).  Do strengthening exercises at least twice a week. This is in addition to the moderate-intensity exercise.  Spend less time sitting. Even light physical activity can be beneficial. Watch cholesterol and blood lipids Have your blood tested for lipids and cholesterol at 40 years of age, then have this test every 5 years. Have your cholesterol levels checked more often if:  Your lipid or cholesterol levels are high.  You are older than 40 years of age.  You are at high risk for heart disease. What should I know about cancer screening? Depending on your health history and family history, you may need to have cancer screening at various ages. This may include screening for:  Breast cancer.  Cervical cancer.  Colorectal cancer.  Skin cancer.  Lung cancer. What should I know about heart disease, diabetes, and high blood  pressure? Blood pressure and heart disease  High blood pressure causes heart disease and increases the risk of stroke. This is more likely to develop in people who have high blood pressure readings, are of African descent, or are overweight.  Have your blood pressure checked: ? Every 3-5 years if you are 18-39 years of age. ? Every year if you are 40 years old or older. Diabetes Have regular diabetes screenings. This checks your fasting blood sugar level. Have the screening done:  Once every three years after age 40 if you are at a normal weight and have a low risk for diabetes.  More often and at a younger age if you are overweight or have a high risk for diabetes. What should I know about preventing infection? Hepatitis B If you have a higher risk for hepatitis B, you should be screened for this virus. Talk with your health care provider to find out if you are at risk for hepatitis B infection. Hepatitis C Testing is recommended for:  Everyone born from 1945 through 1965.  Anyone with known risk factors for hepatitis C. Sexually transmitted infections (STIs)  Get screened for STIs, including gonorrhea and chlamydia, if: ? You are sexually active and are younger than 40 years of age. ? You are older than 40 years of age and your health care provider tells you that you are at risk for this type of infection. ? Your sexual activity has changed since you were last screened, and you are at increased risk for chlamydia or gonorrhea. Ask your health care provider if   you are at risk.  Ask your health care provider about whether you are at high risk for HIV. Your health care provider may recommend a prescription medicine to help prevent HIV infection. If you choose to take medicine to prevent HIV, you should first get tested for HIV. You should then be tested every 3 months for as long as you are taking the medicine. Pregnancy  If you are about to stop having your period (premenopausal) and  you may become pregnant, seek counseling before you get pregnant.  Take 400 to 800 micrograms (mcg) of folic acid every day if you become pregnant.  Ask for birth control (contraception) if you want to prevent pregnancy. Osteoporosis and menopause Osteoporosis is a disease in which the bones lose minerals and strength with aging. This can result in bone fractures. If you are 65 years old or older, or if you are at risk for osteoporosis and fractures, ask your health care provider if you should:  Be screened for bone loss.  Take a calcium or vitamin D supplement to lower your risk of fractures.  Be given hormone replacement therapy (HRT) to treat symptoms of menopause. Follow these instructions at home: Lifestyle  Do not use any products that contain nicotine or tobacco, such as cigarettes, e-cigarettes, and chewing tobacco. If you need help quitting, ask your health care provider.  Do not use street drugs.  Do not share needles.  Ask your health care provider for help if you need support or information about quitting drugs. Alcohol use  Do not drink alcohol if: ? Your health care provider tells you not to drink. ? You are pregnant, may be pregnant, or are planning to become pregnant.  If you drink alcohol: ? Limit how much you use to 0-1 drink a day. ? Limit intake if you are breastfeeding.  Be aware of how much alcohol is in your drink. In the [place] one drink equals one 12 oz bottle of beer (355 mL), one 5 oz glass of wine (148 mL), or one 1 oz glass of hard liquor (44 mL). General instructions  Schedule regular health, dental, and eye exams.  Stay current with your vaccines.  Tell your health care provider if: ? You often feel depressed. ? You have ever been abused or do not feel safe at home. Summary  Adopting a healthy lifestyle and getting preventive care are important in promoting health and wellness.  Follow your health care provider's instructions about healthy  diet, exercising, and getting tested or screened for diseases.  Follow your health care provider's instructions on monitoring your cholesterol and blood pressure. This information is not intended to replace advice given to you by your health care provider. Make sure you discuss any questions you have with your health care provider. Document Revised: 07/28/2018 Document Reviewed: 07/28/2018 Elsevier Patient Education  2020 Elsevier Inc.  

## 2020-07-05 DIAGNOSIS — Z0000 Encounter for general adult medical examination without abnormal findings: Secondary | ICD-10-CM | POA: Diagnosis not present

## 2020-07-05 DIAGNOSIS — Z13220 Encounter for screening for lipoid disorders: Secondary | ICD-10-CM | POA: Diagnosis not present

## 2020-07-05 DIAGNOSIS — F4010 Social phobia, unspecified: Secondary | ICD-10-CM | POA: Diagnosis not present

## 2020-07-05 DIAGNOSIS — Z131 Encounter for screening for diabetes mellitus: Secondary | ICD-10-CM | POA: Diagnosis not present

## 2020-07-05 LAB — CYTOLOGY - PAP
Comment: NEGATIVE
Diagnosis: NEGATIVE
High risk HPV: NEGATIVE

## 2020-07-06 ENCOUNTER — Other Ambulatory Visit: Payer: Self-pay | Admitting: Neurology

## 2020-07-06 DIAGNOSIS — R748 Abnormal levels of other serum enzymes: Secondary | ICD-10-CM

## 2020-07-06 LAB — CBC WITH DIFFERENTIAL/PLATELET
Absolute Monocytes: 334 cells/uL (ref 200–950)
Basophils Absolute: 31 cells/uL (ref 0–200)
Basophils Relative: 0.7 %
Eosinophils Absolute: 163 cells/uL (ref 15–500)
Eosinophils Relative: 3.7 %
HCT: 41.2 % (ref 35.0–45.0)
Hemoglobin: 13.8 g/dL (ref 11.7–15.5)
Lymphs Abs: 735 cells/uL — ABNORMAL LOW (ref 850–3900)
MCH: 31.3 pg (ref 27.0–33.0)
MCHC: 33.5 g/dL (ref 32.0–36.0)
MCV: 93.4 fL (ref 80.0–100.0)
MPV: 9.8 fL (ref 7.5–12.5)
Monocytes Relative: 7.6 %
Neutro Abs: 3137 cells/uL (ref 1500–7800)
Neutrophils Relative %: 71.3 %
Platelets: 314 10*3/uL (ref 140–400)
RBC: 4.41 10*6/uL (ref 3.80–5.10)
RDW: 12.2 % (ref 11.0–15.0)
Total Lymphocyte: 16.7 %
WBC: 4.4 10*3/uL (ref 3.8–10.8)

## 2020-07-06 LAB — COMPLETE METABOLIC PANEL WITH GFR
AG Ratio: 1.3 (calc) (ref 1.0–2.5)
ALT: 39 U/L — ABNORMAL HIGH (ref 6–29)
AST: 21 U/L (ref 10–30)
Albumin: 3.8 g/dL (ref 3.6–5.1)
Alkaline phosphatase (APISO): 47 U/L (ref 31–125)
BUN: 13 mg/dL (ref 7–25)
CO2: 27 mmol/L (ref 20–32)
Calcium: 9.2 mg/dL (ref 8.6–10.2)
Chloride: 103 mmol/L (ref 98–110)
Creat: 0.76 mg/dL (ref 0.50–1.10)
GFR, Est African American: 114 mL/min/{1.73_m2} (ref >=60)
GFR, Est Non African American: 98 mL/min/{1.73_m2} (ref >=60)
Globulin: 3 g/dL (calc) (ref 1.9–3.7)
Glucose, Bld: 96 mg/dL (ref 65–99)
Potassium: 4.2 mmol/L (ref 3.5–5.3)
Sodium: 138 mmol/L (ref 135–146)
Total Bilirubin: 0.5 mg/dL (ref 0.2–1.2)
Total Protein: 6.8 g/dL (ref 6.1–8.1)

## 2020-07-06 LAB — LIPID PANEL W/REFLEX DIRECT LDL
Cholesterol: 231 mg/dL — ABNORMAL HIGH (ref <200)
HDL: 55 mg/dL (ref >=50)
LDL Cholesterol (Calc): 149 mg/dL (calc) — ABNORMAL HIGH
Non-HDL Cholesterol (Calc): 176 mg/dL (calc) — ABNORMAL HIGH (ref <130)
Total CHOL/HDL Ratio: 4.2 (calc) (ref <5.0)
Triglycerides: 138 mg/dL (ref <150)

## 2020-07-06 LAB — TSH: TSH: 1.12 mIU/L

## 2020-07-06 NOTE — Progress Notes (Signed)
Adriana Frazier,   Hemoglobin good.  Thyroid perfect.  Kidney and glucose look good.  ALT up some. Are you taking tylenol or drinking more alcohol?  Stop both and recheck in 2 weeks.  Good cholesterol good.  LDL not to goal.  Need to work on diet. Target low fats and processed food.  Pap- no abnormal cells and negative for HPV. Follow up in 5 years.

## 2020-07-19 DIAGNOSIS — F4010 Social phobia, unspecified: Secondary | ICD-10-CM | POA: Diagnosis not present

## 2020-07-31 DIAGNOSIS — F4010 Social phobia, unspecified: Secondary | ICD-10-CM | POA: Diagnosis not present

## 2020-08-27 ENCOUNTER — Other Ambulatory Visit: Payer: BC Managed Care – PPO

## 2020-09-06 ENCOUNTER — Ambulatory Visit: Payer: BC Managed Care – PPO

## 2020-09-11 ENCOUNTER — Other Ambulatory Visit: Payer: Self-pay | Admitting: Physician Assistant

## 2020-09-11 DIAGNOSIS — F331 Major depressive disorder, recurrent, moderate: Secondary | ICD-10-CM

## 2020-09-11 DIAGNOSIS — F411 Generalized anxiety disorder: Secondary | ICD-10-CM

## 2020-10-02 DIAGNOSIS — F4010 Social phobia, unspecified: Secondary | ICD-10-CM | POA: Diagnosis not present

## 2020-10-11 ENCOUNTER — Other Ambulatory Visit: Payer: Self-pay

## 2020-10-11 ENCOUNTER — Ambulatory Visit (INDEPENDENT_AMBULATORY_CARE_PROVIDER_SITE_OTHER): Payer: BC Managed Care – PPO

## 2020-10-11 DIAGNOSIS — Z1231 Encounter for screening mammogram for malignant neoplasm of breast: Secondary | ICD-10-CM

## 2020-10-15 ENCOUNTER — Other Ambulatory Visit: Payer: Self-pay | Admitting: Physician Assistant

## 2020-10-15 DIAGNOSIS — R928 Other abnormal and inconclusive findings on diagnostic imaging of breast: Secondary | ICD-10-CM

## 2020-10-15 NOTE — Progress Notes (Signed)
Pt will be contacted for more images due to some asymmetry in the right breast.

## 2020-10-20 ENCOUNTER — Ambulatory Visit
Admission: RE | Admit: 2020-10-20 | Discharge: 2020-10-20 | Disposition: A | Discharge status: Home / Self Care | Payer: BC Managed Care – PPO | Source: Ambulatory Visit | Attending: Physician Assistant | Admitting: Physician Assistant

## 2020-10-20 ENCOUNTER — Other Ambulatory Visit: Payer: Self-pay

## 2020-10-20 DIAGNOSIS — R928 Other abnormal and inconclusive findings on diagnostic imaging of breast: Secondary | ICD-10-CM

## 2020-10-20 DIAGNOSIS — N6489 Other specified disorders of breast: Secondary | ICD-10-CM | POA: Diagnosis not present

## 2020-10-20 DIAGNOSIS — R922 Inconclusive mammogram: Secondary | ICD-10-CM | POA: Diagnosis not present

## 2020-10-22 NOTE — Progress Notes (Signed)
No findings of malignancy. Screening mammogram in 1 year.

## 2020-11-06 DIAGNOSIS — F4010 Social phobia, unspecified: Secondary | ICD-10-CM | POA: Diagnosis not present

## 2020-12-03 ENCOUNTER — Encounter: Payer: Self-pay | Admitting: Physician Assistant

## 2020-12-03 DIAGNOSIS — Z789 Other specified health status: Secondary | ICD-10-CM

## 2020-12-04 MED ORDER — NORETHIN ACE-ETH ESTRAD-FE 1.5-30 MG-MCG PO TABS: 1.0000 | ORAL_TABLET | Freq: Every day | ORAL | 1 refills | Status: DC

## 2021-01-02 ENCOUNTER — Encounter: Payer: Self-pay | Admitting: Physician Assistant

## 2021-01-04 ENCOUNTER — Encounter: Payer: Self-pay | Admitting: Physician Assistant

## 2021-01-04 ENCOUNTER — Ambulatory Visit: Payer: BC Managed Care – PPO | Admitting: Physician Assistant

## 2021-01-04 ENCOUNTER — Other Ambulatory Visit: Payer: Self-pay

## 2021-01-04 VITALS — BP 108/71 | HR 80 | Ht 63.0 in | Wt 167.0 lb

## 2021-01-04 DIAGNOSIS — K649 Unspecified hemorrhoids: Secondary | ICD-10-CM | POA: Diagnosis not present

## 2021-01-04 DIAGNOSIS — K5901 Slow transit constipation: Secondary | ICD-10-CM

## 2021-01-04 DIAGNOSIS — K50819 Crohn's disease of both small and large intestine with unspecified complications: Secondary | ICD-10-CM

## 2021-01-04 DIAGNOSIS — K5904 Chronic idiopathic constipation: Secondary | ICD-10-CM | POA: Insufficient documentation

## 2021-01-04 MED ORDER — HYDROCORTISONE 0.5 % EX CREA: 1.0000 "application " | TOPICAL_CREAM | Freq: Two times a day (BID) | CUTANEOUS | 1 refills | Status: DC

## 2021-01-04 MED ORDER — HYDROCORTISONE ACETATE 25 MG RE SUPP: 25.0000 mg | Freq: Two times a day (BID) | RECTAL | 1 refills | Status: DC | PRN

## 2021-01-04 NOTE — Patient Instructions (Signed)

## 2021-01-04 NOTE — Progress Notes (Signed)
Subjective:    Patient ID: Adriana Frazier, female    DOB: 06-21-1981, 40 y.o.   MRN: [id number]  HPI  Patient is a 40 year old female with chronic constipation, Crohn's, ulcerative colitis, hemorrhoids who presents to the clinic to discuss hemorrhoids.  She does admit to having some intermittent constipation.  She is not taking any daily medication to help control this.  She has not had any Crohn's or ulcerative colitis flares recently. She is on Apriso for prevention.  She has had intermittent bright red blood in stool and known hemorrhoids.  Today she is presenting with more anal itching and discomfort.  She is using witch hazel pads which helps some. Denies any abdominal pain, fever, chills, nausea or vomiting recently.   .. Active Ambulatory Problems    Diagnosis Date Noted  . Depression 08/04/2014  . Generalized anxiety disorder 08/04/2014  . Ulcerative colitis (HCC) 08/04/2014  . Excess or deficiency of vitamin D 11/02/2013  . Hyperlipidemia 06/23/2016  . Crohn's ileocolitis (HCC) 08/29/2016  . Class 1 obesity due to excess calories without serious comorbidity with body mass index (BMI) of 30.0 to 30.9 in adult 07/25/2018  . Uses birth control 07/25/2018  . Hemorrhoids 05/31/2019  . Chronic idiopathic constipation 01/04/2021   Resolved Ambulatory Problems    Diagnosis Date Noted  . Mass of left side of neck 08/04/2014  . AMA (advanced maternal age) multigravida 35+ 05/25/2015  . Supervision of other high risk pregnancies, first trimester 06/25/2015  . Low lying placenta with hemorrhage, antepartum 08/11/2015  . Ultrasound recheck of fetal pyelectasis, antepartum 11/25/2015  . Preterm premature rupture of membranes (PPROM) with onset of labor within 24 hours of rupture in third trimester, antepartum 12/12/2015  . SVD (spontaneous vaginal delivery) 12/12/2015  . Tick bite 01/01/2016  . Ringworm 01/31/2018  . Neck tightness 01/18/2019   Past Medical History:  Diagnosis Date  .  Anxiety       Review of Systems See HPI.     Objective:   Physical Exam Vitals reviewed.  Constitutional:      Appearance: Normal appearance. She is obese.  Cardiovascular:     Rate and Rhythm: Normal rate.     Pulses: Normal pulses.  Pulmonary:     Effort: Pulmonary effort is normal.  Abdominal:     General: Bowel sounds are normal. There is no distension.     Palpations: Abdomen is soft. There is no mass.     Tenderness: There is no abdominal tenderness. There is no right CVA tenderness, left CVA tenderness, guarding or rebound.  Genitourinary:    Rectum: Guaiac result negative.     Comments: One external hemorroid non-thrombosed.  Neurological:     General: No focal deficit present.     Mental Status: She is alert and oriented to person, place, and time.  Psychiatric:        Mood and Affect: Mood normal.        Assessment & Plan:  Marland KitchenMarland KitchenDiagnoses and all orders for this visit:  Hemorrhoids, unspecified hemorrhoid type -     hydrocortisone (ANUSOL-HC) 25 MG suppository; Place 1 suppository (25 mg total) rectally 2 (two) times daily as needed for hemorrhoids. -     hydrocortisone cream 0.5 %; Apply 1 application topically 2 (two) times daily.  Slow transit constipation   Negative hemoccult. Reassured no thrombosed hemorrhoids today. Sent suppositories and cream for hemorrhoids. Sitz baths and witch hazel are great for flares. Discussed prevention with normal soft bowel movements. Encouraged  probiotics, swiss kriss, miralax or stool softeners. Discussed foods that help with constipation.  Follow up as needed.

## 2021-01-07 ENCOUNTER — Encounter: Payer: Self-pay | Admitting: Physician Assistant

## 2021-01-07 DIAGNOSIS — K5180 Other ulcerative colitis without complications: Secondary | ICD-10-CM | POA: Diagnosis not present

## 2021-03-13 ENCOUNTER — Other Ambulatory Visit: Payer: Self-pay | Admitting: Physician Assistant

## 2021-03-13 DIAGNOSIS — F411 Generalized anxiety disorder: Secondary | ICD-10-CM

## 2021-03-13 DIAGNOSIS — F331 Major depressive disorder, recurrent, moderate: Secondary | ICD-10-CM

## 2021-05-06 ENCOUNTER — Other Ambulatory Visit: Payer: Self-pay | Admitting: Physician Assistant

## 2021-05-06 DIAGNOSIS — Z789 Other specified health status: Secondary | ICD-10-CM

## 2021-06-11 ENCOUNTER — Other Ambulatory Visit: Payer: Self-pay | Admitting: Physician Assistant

## 2021-06-11 DIAGNOSIS — F411 Generalized anxiety disorder: Secondary | ICD-10-CM

## 2021-06-11 DIAGNOSIS — F331 Major depressive disorder, recurrent, moderate: Secondary | ICD-10-CM

## 2021-07-22 ENCOUNTER — Encounter: Payer: Self-pay | Admitting: Physician Assistant

## 2021-07-22 ENCOUNTER — Other Ambulatory Visit: Payer: Self-pay

## 2021-07-22 ENCOUNTER — Ambulatory Visit: Payer: BC Managed Care – PPO | Admitting: Physician Assistant

## 2021-07-22 VITALS — BP 113/77 | HR 82 | Temp 98.1°F | Ht 63.0 in | Wt 172.0 lb

## 2021-07-22 DIAGNOSIS — F331 Major depressive disorder, recurrent, moderate: Secondary | ICD-10-CM | POA: Diagnosis not present

## 2021-07-22 DIAGNOSIS — F411 Generalized anxiety disorder: Secondary | ICD-10-CM | POA: Diagnosis not present

## 2021-07-22 DIAGNOSIS — F520 Hypoactive sexual desire disorder: Secondary | ICD-10-CM

## 2021-07-22 MED ORDER — BUPROPION HCL ER (SR) 150 MG PO TB12: ORAL_TABLET | ORAL | 1 refills | Status: DC

## 2021-07-22 MED ORDER — FLUOXETINE HCL 10 MG PO CAPS: 10.0000 mg | ORAL_CAPSULE | Freq: Every day | ORAL | 1 refills | Status: DC

## 2021-07-22 NOTE — Patient Instructions (Addendum)
Decreased prozac to 10mg  daily.   Flibanserin Oral Tablets What is this medication? FLIBANSERIN (fly BAN ser in) is used to treat hypoactive (low) sexual desire disorder (HSDD) in women who have not gone through menopause, who have not had low sexual desire in the past, and who have low sexual desire no matter the type of sexual activity, the situation, or the sexual partner. Women with HSDD have a low sexual desire that is troubling to them, and is not due to a medical or mental health problem, problems in the relationship, medicines, or drug abuse. This medicine is not for HSDD in women who have gone through menopause. This medicine is not for men. This medicine not for use to improve sexual performance. This medicine may be used for other purposes; ask your health care provider or pharmacist if you have questions. COMMON BRAND NAME(S): Addyi What should I tell my care team before I take this medication? They need to know if you have any of these conditions: dehydration if you drink alcohol drug abuse or addiction heart disease history of depression or other mental health problems history of a drug or alcohol abuse problem liver disease low blood pressure an unusual or allergic reaction to flibanserin, other medicines, foods, dyes, or preservatives pregnant or trying to get pregnant breast-feeding How should I use this medication? Take this medicine by mouth with water. Take it as directed on the prescription label. This medicine should only be taken at bedtime. Do not take this medicine with grapefruit juice. If you have 1 or 2 alcohol-containing drinks, wait at least 2 hours before taking this medicine at bedtime. Do not take your bedtime dose if you have consumed 3 or more alcohol-containing drinks. After taking this medicine at bedtime, do not drink alcohol until the next day. A special MedGuide will be given to you by the pharmacist with each prescription and refill. Be sure to read  this information carefully each time. Talk to your health care provider about the use of this medicine in children. It is not approved for use in children. Overdosage: If you think you have taken too much of this medicine contact a poison control center or emergency room at once. NOTE: This medicine is only for you. Do not share this medicine with others. What if I miss a dose? If you miss your dose at bedtime, skip the missed dose and take the next dose at bedtime the next day. Do not take this medicine the next morning or double your next dose. What may interact with this medication? Do not take this medicine with any of the following medications: berotralstat certain antivirals for HIV or hepatitis certain medicines for fungal infections like fluconazole, ketoconazole, itraconazole, or posaconazole ciprofloxacin clarithromycin conivaptan diltiazem erythromycin grapefruit juice nefazodone telithromycin verapamil This medicine may also interact with the following medications: alcohol birth control pills bupropion certain medicines for anxiety or sleep certain medicines for seizures like carbamazepine, phenobarbital, phenytoin certain medicines for stomach problems like cimetidine, esomeprazole, dexlansoprazole, lansoprazole, omeprazole, pantoprazole, rabeprazole, ranitidine digoxin diphenhydramine etravirine fluoxetine fluvoxamine ginkgo biloba lorcaserin narcotic medicines for pain resveratrol rifabutin rifampin rifapentine sirolimus St. John's Wort This list may not describe all possible interactions. Give your health care provider a list of all the medicines, herbs, non-prescription drugs, or dietary supplements you use. Also tell them if you smoke, drink alcohol, or use illegal drugs. Some items may interact with your medicine. What should I watch for while using this medication? Visit your doctor or health  care provider for regular checks on your progress. Tell your  health care provider if your symptoms do not start to get better after you have taken this medicine for 8 weeks. You may get drowsy or dizzy. Do not drive, use machinery, or do anything that needs mental alertness for at least 6 hours after your dose and until you know how this medicine affects you. Do not stand up or sit up quickly. This reduces the risk of dizzy or fainting spells. Alcohol can increase dizziness and drowsiness, and can increase the risk of low blood pressure or fainting spells when combined with this medicine. Wait at least 2 hours after consuming 1 or 2 standard alcoholic drinks before taking this medicine at bedtime. Do not take this medicine at bedtime if you have consumed 3 or more standard alcoholic drinks that evening. What side effects may I notice from receiving this medication? Side effects that you should report to your doctor or health care professional as soon as possible: allergic reactions (skin rash, itching or hives; swelling of the face, lips, or tongue) extreme drowsiness low blood pressure (dizziness; feeling faint or lightheaded, falls; unusually weak or tired) Side effects that usually do not require medical attention (report to your doctor or health care professional if they continue or are bothersome): dry mouth nausea tiredness trouble sleeping This list may not describe all possible side effects. Call your doctor for medical advice about side effects. You may report side effects to FDA at 1-800-FDA-1088. Where should I keep my medication? Keep out of the reach of children and pets. Store at [employer] [address] (77 degrees F). Get rid of any unused medicine after the expiration date. To get rid of medicines that are no longer needed or have expired: Take the medicine to a medicine take-back program. Check with your pharmacy or law enforcement to find a location. If you cannot return the medicine, check the label or package insert to see if the medicine should be  thrown out in the garbage or flushed down the toilet. If you are not sure, ask your health care provider. If it is safe to put it in the trash, take the medicine out of the container. Mix the medicine with cat litter, dirt, coffee grounds, or other unwanted substance. Seal the mixture in a bag or container. Put it in the trash. NOTE: This sheet is a summary. It may not cover all possible information. If you have questions about this medicine, talk to your doctor, pharmacist, or health care provider.  2022 Elsevier/Gold Standard (2021-04-23 00:00:00)

## 2021-07-22 NOTE — Progress Notes (Signed)
Subjective:    Patient ID: Adriana Frazier, female    DOB: Sep 27, 1980, 40 y.o.   MRN: [id number]  HPI Pt is a 40 yo female with MDD and GAD who presents to the clinic for follow up and medication refills.   Patient is on Wellbutrin and Prozac.  Prozac was the last medication added to her list for mood.  She had similar blunted affect at last follow-up and Prozac was decreased.  She did notice a minimal improvement.  She is now able to cry movies.  She does endorse low sexual desire.  She can orgasm but it is very difficult.  She is finding most of her issues with her bluntness with the relationship with her husband.  She is making time for herself with yoga.  She denies any suicidal thoughts or homicidal idealizations.  She does not feel depressed.   .. 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   Resolved Ambulatory Problems    Diagnosis Date Noted   Mass of left side of neck 08/04/2014   AMA (advanced maternal age) multigravida 35+ 05/25/2015   Supervision of other high risk pregnancies, first trimester 06/25/2015   Low lying placenta with hemorrhage, antepartum 08/11/2015   Ultrasound recheck of fetal pyelectasis, antepartum 11/25/2015   Preterm premature rupture of membranes (PPROM) with onset of labor within 24 hours of rupture in third trimester, antepartum 12/12/2015   SVD (spontaneous vaginal delivery) 12/12/2015   Tick bite 01/01/2016   Ringworm 01/31/2018   Neck tightness  01/18/2019   Past Medical History:  Diagnosis Date   Anxiety      Review of Systems    See HPI.  Objective:   Physical Exam Vitals reviewed.  Constitutional:      Appearance: Normal appearance. She is obese.  HENT:     Head: Normocephalic.  Cardiovascular:     Rate and Rhythm: Normal rate.  Pulmonary:     Effort: Pulmonary effort is normal.  Neurological:     General: No focal deficit present.     Mental Status: She is alert and oriented to person, place, and time.  Psychiatric:        Mood and Affect: Mood normal.       Depression screen [facility] 2/9 07/22/2021 07/04/2020 01/02/2020 08/06/2015  Decreased Interest 1 0 0 0  Down, Depressed, Hopeless 1 0 0 0  PHQ - 2 Score 2 0 0 0  Altered sleeping 0 0 0 -  Tired, decreased energy 1 1 0 -  Change in appetite 1 0 0 -  Feeling bad or failure about yourself  0 0 0 -  Trouble concentrating 0 0 0 -  Moving slowly or fidgety/restless 1 0 0 -  Suicidal thoughts 0 0 0 -  PHQ-9 Score 5 1 0 -  Difficult doing work/chores Somewhat difficult - Not difficult at all -  Some encounter information is confidential and restricted. Go to Review Flowsheets activity to see all data.   .. GAD 7 : Generalized  Anxiety Score 07/22/2021 07/04/2020 01/02/2020 08/06/2015  Nervous, Anxious, on Edge 1 0 1 0  Control/stop worrying 1 0 0 0  Worry too much - different things 0 0 0 0  Trouble relaxing 1 0 0 0  Restless 0 0 0 0  Easily annoyed or irritable 1 0 1 1  Afraid - awful might happen 0 0 0 0  Total GAD 7 Score 4 0 2 1  Anxiety Difficulty Somewhat difficult Not difficult at all Somewhat difficult Not difficult at all  Some encounter information is confidential and restricted. Go to Review Flowsheets activity to see all data.        Assessment & Plan:  Marland KitchenMarland KitchenLucindy was seen today for depression, anxiety and affective blunting.  Diagnoses and all orders for this visit:  Moderate episode of recurrent major depressive disorder (HCC) -      FLUoxetine (PROZAC) 10 MG capsule; Take 1 capsule (10 mg total) by mouth daily. -     buPROPion (WELLBUTRIN SR) 150 MG 12 hr tablet; Take 1 tablet (150 mg total) by mouth twice daily.  Generalized anxiety disorder -     FLUoxetine (PROZAC) 10 MG capsule; Take 1 capsule (10 mg total) by mouth daily. -     buPROPion (WELLBUTRIN SR) 150 MG 12 hr tablet; Take 1 tablet (150 mg total) by mouth twice daily.  Hypoactive sexual desire disorder  Decrease prozac to 10mg  daily.  Continue wellbutrin.  Follow up in 6 weeks.  Consider Addyi for sexual desire.  HO given.  Pt will consider.  Follow up in 6 weeks.

## 2021-08-04 ENCOUNTER — Other Ambulatory Visit: Payer: Self-pay | Admitting: Physician Assistant

## 2021-08-04 DIAGNOSIS — Z789 Other specified health status: Secondary | ICD-10-CM

## 2021-09-07 ENCOUNTER — Other Ambulatory Visit: Payer: Self-pay | Admitting: Physician Assistant

## 2021-09-07 DIAGNOSIS — F411 Generalized anxiety disorder: Secondary | ICD-10-CM

## 2021-09-07 DIAGNOSIS — F331 Major depressive disorder, recurrent, moderate: Secondary | ICD-10-CM

## 2021-10-04 ENCOUNTER — Encounter: Payer: Self-pay | Admitting: Physician Assistant

## 2021-10-21 ENCOUNTER — Other Ambulatory Visit (HOSPITAL_BASED_OUTPATIENT_CLINIC_OR_DEPARTMENT_OTHER): Payer: Self-pay | Admitting: Physician Assistant

## 2021-10-21 DIAGNOSIS — Z1231 Encounter for screening mammogram for malignant neoplasm of breast: Secondary | ICD-10-CM

## 2021-10-23 ENCOUNTER — Ambulatory Visit (INDEPENDENT_AMBULATORY_CARE_PROVIDER_SITE_OTHER): Payer: BC Managed Care – PPO

## 2021-10-23 ENCOUNTER — Other Ambulatory Visit: Payer: Self-pay

## 2021-10-23 DIAGNOSIS — Z1231 Encounter for screening mammogram for malignant neoplasm of breast: Secondary | ICD-10-CM

## 2021-10-23 NOTE — Progress Notes (Signed)
Normal mammogram. Follow up in 1 year.

## 2022-01-17 ENCOUNTER — Ambulatory Visit: Payer: BC Managed Care – PPO | Admitting: Physician Assistant

## 2022-01-22 ENCOUNTER — Encounter: Payer: Self-pay | Admitting: Physician Assistant

## 2022-01-22 ENCOUNTER — Ambulatory Visit: Payer: BC Managed Care – PPO | Admitting: Physician Assistant

## 2022-01-22 VITALS — BP 112/70 | HR 85 | Ht 63.0 in | Wt 169.0 lb

## 2022-01-22 DIAGNOSIS — F411 Generalized anxiety disorder: Secondary | ICD-10-CM | POA: Diagnosis not present

## 2022-01-22 DIAGNOSIS — F520 Hypoactive sexual desire disorder: Secondary | ICD-10-CM | POA: Diagnosis not present

## 2022-01-22 DIAGNOSIS — F331 Major depressive disorder, recurrent, moderate: Secondary | ICD-10-CM | POA: Diagnosis not present

## 2022-01-22 MED ORDER — VORTIOXETINE HBR 5 MG PO TABS: 5.0000 mg | ORAL_TABLET | Freq: Every day | ORAL | 1 refills | Status: DC

## 2022-01-22 NOTE — Patient Instructions (Signed)
Major Depressive Disorder, Adult Major depressive disorder (MDD) is a mental health condition. It may also be called clinical depression or unipolar depression. MDD causes symptoms of sadness, hopelessness, and loss of interest in things. These symptoms last most of the day, almost every day, for 2 weeks. MDD can also cause physical symptoms. It can interfere with relationships and with everyday activities, such as work, school, and activities that are usually pleasant. MDD may be mild, moderate, or severe. It may be single-episode MDD, which happens once, or recurrent MDD, which may occur multiple times. What are the causes? The exact cause of this condition is not known. MDD is most likely caused by a combination of things, which may include: Your personality traits. Learned or conditioned behaviors or thoughts or feelings that reinforce negativity. Any alcohol or substance misuse. Long-term (chronic) physical or mental health illness. Going through a traumatic experience or major life changes. What increases the risk? The following factors may make someone more likely to develop MDD: A family history of depression. Being a woman. Troubled family relationships. Abnormally low levels of certain brain chemicals. Traumatic or painful events in childhood, especially abuse or loss of a parent. A lot of stress from life experiences, such as poor living conditions or discrimination. Chronic physical illness or other mental health disorders. What are the signs or symptoms? The main symptoms of MDD usually include: Constant depressed or irritable mood. A loss of interest in things and activities. Other symptoms include: Sleeping or eating too much or too little. Unexplained weight gain or weight loss. Tiredness or low energy. Being agitated, restless, or weak. Feeling hopeless, worthless, or guilty. Trouble thinking clearly or making decisions. Thoughts of suicide or thoughts of harming  others. Isolating oneself or avoiding other people or activities. Trouble completing tasks, work, or any normal obligations. Severe symptoms of this condition may include: Psychotic depression.This may include false beliefs, or delusions. It may also include seeing, hearing, tasting, smelling, or feeling things that are not real (hallucinations). Chronic depression or persistent depressive disorder. This is low-level depression that lasts for at least 2 years. Melancholic depression, or feeling extremely sad and hopeless. Catatonic depression, which includes trouble speaking and trouble moving. How is this diagnosed? This condition may be diagnosed based on: Your symptoms. Your medical and mental health history. You may be asked questions about your lifestyle, including any drug and alcohol use. A physical exam. Blood tests to rule out other conditions. MDD is confirmed if you have the following symptoms most of the day, nearly every day, in a 2-week period: Either a depressed mood or loss of interest. At least four other MDD symptoms. How is this treated? This condition is usually treated by mental health professionals, such as psychologists, psychiatrists, and clinical social workers. You may need more than one type of treatment. Treatment may include: Psychotherapy, also called talk therapy or counseling. Types of psychotherapy include: Cognitive behavioral therapy (CBT). This teaches you to recognize unhealthy feelings, thoughts, and behaviors, and replace them with positive thoughts and actions. Interpersonal therapy (IPT). This helps you to improve the way you communicate with others or relate to them. Family therapy. This treatment includes members of your family. Medicines to treat anxiety and depression. These medicines help to balance the brain chemicals that affect your emotions. Lifestyle changes. You may be asked to: Limit alcohol use and avoid drug use. Get regular  exercise. Get plenty of sleep. Make healthy eating choices. Spend more time outdoors. Brain stimulation. This may   be done if symptoms are very severe and other treatments have not worked. Examples of this treatment are electroconvulsive therapy and transcranial magnetic stimulation. Follow these instructions at home: Activity Exercise regularly and spend time outdoors. Find activities that you enjoy doing, and make time to do them. Find healthy ways to manage stress, such as: Meditation or deep breathing. Spending time in nature. Journaling. Return to your normal activities as told by your health care provider. Ask your health care provider what activities are safe for you. Alcohol and drug use If you drink alcohol: Limit how much you use to: 0-1 drink a day for women who are not pregnant. 0-2 drinks a day for men. Be aware of how much alcohol is in your drink. In the [place] one drink equals one 12 oz bottle of beer (355 mL), one 5 oz glass of wine (148 mL), or one 1 oz glass of hard liquor (44 mL). Discuss your alcohol use with your health care provider. Alcohol can affect any antidepressant medicines you are taking. Discuss any drug use with your health care provider. General instructions  Take over-the-counter and prescription medicines only as told by your health care provider. Eat a healthy diet and get plenty of sleep. Consider joining a support group. Your health care provider may be able to recommend one. Keep all follow-up visits as told by your health care provider. This is important. Where to find more information National Alliance on Mental Illness: www.nami.org [place] National Institute of Mental Health: www.nimh.nih.gov Contact a health care provider if: Your symptoms get worse. You develop new symptoms. Get help right away if: You self-harm. You have serious thoughts about hurting yourself or others. You hallucinate. If you ever feel like you may hurt yourself or  others, or have thoughts about taking your own life, get help right away. Go to your nearest emergency department or: Call your local emergency services (911 in the [place] Call a suicide crisis helpline, such as the National Suicide Prevention Lifeline at 1-800-273-8255 or 988 in the [place] This is open 24 hours a day in the [place] Text the Crisis Text Line at 741741 (in the [place] Summary Major depressive disorder (MDD) is a mental health condition. MDD causes symptoms of sadness, hopelessness, and loss of interest in things. These symptoms last most of the day, almost every day, for 2 weeks. The symptoms of MDD can interfere with relationships and with everyday activities. Treatments and support are available for people who develop MDD. You may need more than one type of treatment. Get help right away if you have serious thoughts about hurting yourself or others. This information is not intended to replace advice given to you by your health care provider. Make sure you discuss any questions you have with your health care provider. Document Revised: 02/27/2021 Document Reviewed: 07/16/2019 Elsevier Patient Education  2023 Elsevier Inc.  

## 2022-01-22 NOTE — Progress Notes (Signed)
Established Patient Office Visit  Subjective   Patient ID: Adriana Frazier, female    DOB: Mar 31, 1981  Age: 41 y.o. MRN: [id number]  Chief Complaint  Patient presents with   Follow-up   Depression    HPI Pt is a 41 yo female with anxiety and depression who presents to the clinic to discuss medications. She has stopped all medication because she feels like it makes her too "numb". She is having more problems with depression currently. She has no libido. She feels fatigued. She has tried Careers information officer, prozac, wellbutrin in the past. No SI/HC. 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   .. 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   Moderate episode of recurrent major depressive disorder (HCC) 01/24/2022   Resolved Ambulatory Problems    Diagnosis Date Noted   Mass of left side of neck 08/04/2014   AMA (advanced maternal age) multigravida 35+ 05/25/2015   Supervision of other high risk pregnancies, first trimester 06/25/2015   Low lying placenta with hemorrhage, antepartum 08/11/2015   Ultrasound recheck of fetal pyelectasis, antepartum 11/25/2015   Preterm premature rupture of membranes (PPROM) with onset of labor within 24 hours of rupture in third trimester, antepartum 12/12/2015   SVD (spontaneous vaginal delivery) 12/12/2015   Tick bite 01/01/2016   Ringworm 01/31/2018   Neck tightness 01/18/2019   Past Medical History:  Diagnosis Date   Anxiety       ROS See HPI.    Objective:     BP 112/70   Pulse 85   Ht 5\' 3"  (1.6 m)   Wt 169 lb (76.7 kg)   SpO2 99%   BMI 29.94 kg/m  BP Readings from Last 3 Encounters:  01/23/22 115/82  01/22/22 112/70  07/22/21 113/77   Wt Readings from Last 3 Encounters:  01/23/22 169 lb (76.7 kg)  01/22/22 169 lb (76.7 kg)  07/22/21 172 lb (78 kg)    ..    01/22/2022    2:32 PM 07/22/2021   11:56 AM 07/04/2020    9:30 AM 01/02/2020   11:15 AM 07/23/2018    2:21 PM  Depression screen PHQ 2/9  Decreased Interest 2 1 0 0   Down, Depressed, Hopeless 2 1 0 0   PHQ - 2 Score 4 2 0 0   Altered sleeping 0 0 0 0   Tired, decreased energy 1 1 1  0   Change in appetite 0 1 0 0   Feeling bad or failure about yourself  2 0 0 0   Trouble concentrating 1 0 0 0   Moving slowly or fidgety/restless 0 1 0 0   Suicidal thoughts 0 0 0 0   PHQ-9 Score 8 5 1  0   Difficult doing work/chores Somewhat difficult Somewhat difficult  Not difficult at all      Information is confidential and restricted. Go to Review Flowsheets to unlock data.   ..    01/22/2022    2:32 PM  07/22/2021   11:56 AM 07/04/2020    9:31 AM 01/02/2020   11:14 AM  GAD 7 : Generalized Anxiety Score  Nervous, Anxious, on Edge 1 1 0 1  Control/stop worrying 1 1 0 0  Worry too much - different things 0 0 0 0  Trouble relaxing 1 1 0 0  Restless 0 0 0 0  Easily annoyed or irritable 1 1 0 1  Afraid - awful might happen 0 0 0 0  Total GAD 7 Score 4 4 0 2  Anxiety Difficulty Not difficult at all Somewhat difficult Not difficult at all Somewhat difficult      Physical Exam Constitutional:      Appearance: Normal appearance.  HENT:     Head: Normocephalic.  Cardiovascular:     Rate and Rhythm: Normal rate.  Pulmonary:     Effort: Pulmonary effort is normal.  Neurological:     General: No focal deficit present.     Mental Status: She is alert.  Psychiatric:        Mood and Affect: Mood normal.        Assessment & Plan:   Marland KitchenMarland KitchenEvella was seen today for follow-up and depression.  Diagnoses and all orders for this visit:  Generalized anxiety disorder -     vortioxetine HBr (TRINTELLIX) 5 MG TABS tablet; Take 1 tablet (5 mg total) by mouth daily.  Hypoactive sexual desire disorder  Moderate episode of recurrent major depressive disorder (HCC) -     vortioxetine HBr (TRINTELLIX) 5 MG TABS tablet; Take 1 tablet (5 mg total) by mouth daily.   PHQ/GAD numbers not to goal Make sense since she is out of all medications She is in counseling Start trintellix Discussed side effects Follow up with GYN for hyposexual drive  Encouraged regular exercise Return if symptoms worsen or fail to improve, for 4-6 weeks for F/U on depression/anxiety.    Tandy Gaw, PA-C

## 2022-01-23 ENCOUNTER — Encounter: Payer: Self-pay | Admitting: Family Medicine

## 2022-01-23 ENCOUNTER — Ambulatory Visit: Payer: BC Managed Care – PPO | Admitting: Family Medicine

## 2022-01-23 DIAGNOSIS — F520 Hypoactive sexual desire disorder: Secondary | ICD-10-CM

## 2022-01-23 NOTE — Assessment & Plan Note (Signed)
They may or may not be related to her medication, anxiety, depression, ongoing marital and family stressors.  We may consider add back of Wellbutrin.  Continue ongoing couples therapy.  Consider a break from family and social stressors a 90-day night out with her husband.

## 2022-01-23 NOTE — Progress Notes (Signed)
   Subjective:    Patient ID: Adriana Frazier is a 41 y.o. female presenting with New GYN  on 01/23/2022  HPI: Previously been on Prozac (3 months) and wellbutrin(1-2 months) and came off over the last 6 moths.  It appears she came off because her husband felt like she was in her normal self.  Has come off this and now having more nausea. Now having a day of sadness. Depression feels mild. Just recently had trintellix added. Reports 6 months of poor libido. Will not initiate and this has been going on for a long time. What is concerning, is that while being intimate, she does not want her breasts to be involved. Once being in the moment is enjoying it and then a shift happens and she is no longer enjoying it.  Patient reports a son on the autism spectrum.  This creates some conflict around the house especially in the evenings.  Patient is quite tearful in discussing this.  Patient's father-in-law recently moved into the house which may be adding yet another level of stress.  Review of Systems  Constitutional:  Negative for chills and fever.  Respiratory:  Negative for shortness of breath.   Cardiovascular:  Negative for chest pain.  Gastrointestinal:  Negative for abdominal pain, nausea and vomiting.  Genitourinary:  Negative for dysuria.  Skin:  Negative for rash.      Objective:    BP 115/82   Pulse 61   Ht 5\' 3"  (1.6 m)   Wt 169 lb (76.7 kg)   LMP 01/20/2022   BMI 29.94 kg/m  Physical Exam Exam conducted with a chaperone present.  Constitutional:      General: She is not in acute distress.    Appearance: She is well-developed.  HENT:     Head: Normocephalic and atraumatic.  Eyes:     General: No scleral icterus. Cardiovascular:     Rate and Rhythm: Normal rate.  Pulmonary:     Effort: Pulmonary effort is normal.  Abdominal:     Palpations: Abdomen is soft.  Musculoskeletal:     Cervical back: Neck supple.  Skin:    General: Skin is warm and dry.  Neurological:      Mental Status: She is alert and oriented to person, place, and time.         Assessment & Plan:   Problem List Items Addressed This Visit       Unprioritized   Hypoactive sexual desire disorder    They may or may not be related to her medication, anxiety, depression, ongoing marital and family stressors.  We may consider add back of Wellbutrin.  Continue ongoing couples therapy.  Consider a break from family and social stressors a 90-day night out with her husband.       Total time in review of prior notes, labs, history taking, review with patient, exam, note writing, discussion of options, exploration of family dynamics and stressors in her life, plan for next steps, alternatives and risks of treatment: 31 minutes.  Return in about 3 months (around 04/25/2022) for a follow-up.  Donnamae Jude, MD 01/23/2022 10:14 AM

## 2022-01-24 ENCOUNTER — Encounter: Payer: Self-pay | Admitting: Physician Assistant

## 2022-01-24 DIAGNOSIS — F331 Major depressive disorder, recurrent, moderate: Secondary | ICD-10-CM | POA: Insufficient documentation

## 2022-02-10 ENCOUNTER — Encounter: Payer: Self-pay | Admitting: Physician Assistant

## 2022-03-03 ENCOUNTER — Encounter: Payer: Self-pay | Admitting: Physician Assistant

## 2022-03-03 DIAGNOSIS — R748 Abnormal levels of other serum enzymes: Secondary | ICD-10-CM

## 2022-03-05 DIAGNOSIS — R748 Abnormal levels of other serum enzymes: Secondary | ICD-10-CM | POA: Diagnosis not present

## 2022-03-06 LAB — COMPLETE METABOLIC PANEL WITH GFR
AG Ratio: 1.4 (calc) (ref 1.0–2.5)
ALT: 138 U/L — ABNORMAL HIGH (ref 6–29)
AST: 66 U/L — ABNORMAL HIGH (ref 10–30)
Albumin: 4 g/dL (ref 3.6–5.1)
Alkaline phosphatase (APISO): 46 U/L (ref 31–125)
BUN: 10 mg/dL (ref 7–25)
CO2: 26 mmol/L (ref 20–32)
Calcium: 9.3 mg/dL (ref 8.6–10.2)
Chloride: 104 mmol/L (ref 98–110)
Creat: 0.86 mg/dL (ref 0.50–0.99)
Globulin: 2.9 g/dL (calc) (ref 1.9–3.7)
Glucose, Bld: 85 mg/dL (ref 65–99)
Potassium: 4.4 mmol/L (ref 3.5–5.3)
Sodium: 140 mmol/L (ref 135–146)
Total Bilirubin: 0.6 mg/dL (ref 0.2–1.2)
Total Protein: 6.9 g/dL (ref 6.1–8.1)
eGFR: 87 mL/min/{1.73_m2} (ref >=60)

## 2022-03-07 ENCOUNTER — Encounter: Payer: Self-pay | Admitting: Physician Assistant

## 2022-03-07 DIAGNOSIS — R748 Abnormal levels of other serum enzymes: Secondary | ICD-10-CM

## 2022-03-07 NOTE — Progress Notes (Signed)
Your liver enzymes did elevate from last check. This can be from mental health medications from time to time. Where should we send your labs to?   Avoid any alcohol or tylenol products.   Any other changes except medication?

## 2022-03-14 ENCOUNTER — Other Ambulatory Visit: Payer: Self-pay | Admitting: Physician Assistant

## 2022-03-14 DIAGNOSIS — F331 Major depressive disorder, recurrent, moderate: Secondary | ICD-10-CM

## 2022-03-14 DIAGNOSIS — F411 Generalized anxiety disorder: Secondary | ICD-10-CM

## 2022-03-18 DIAGNOSIS — R7989 Other specified abnormal findings of blood chemistry: Secondary | ICD-10-CM | POA: Diagnosis not present

## 2022-03-18 DIAGNOSIS — K5180 Other ulcerative colitis without complications: Secondary | ICD-10-CM | POA: Diagnosis not present

## 2022-03-20 ENCOUNTER — Ambulatory Visit (INDEPENDENT_AMBULATORY_CARE_PROVIDER_SITE_OTHER): Payer: BC Managed Care – PPO

## 2022-03-20 DIAGNOSIS — R748 Abnormal levels of other serum enzymes: Secondary | ICD-10-CM | POA: Diagnosis not present

## 2022-03-20 DIAGNOSIS — R945 Abnormal results of liver function studies: Secondary | ICD-10-CM | POA: Diagnosis not present

## 2022-03-21 NOTE — Progress Notes (Signed)
[name], your [place] overall looks good. No sign of cirrhosis or fatty liver. 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 These are not harmful. Continue to avoid alcohol and tylenol. Let recheck your enzymes in the next week or so.  If they are still high then we may need to consider if the Effexor is causing this

## 2022-04-02 NOTE — Addendum Note (Signed)
Addended by: Chalmers Cater on: 04/02/2022 10:16 AM   Modules accepted: Orders

## 2022-04-04 DIAGNOSIS — R748 Abnormal levels of other serum enzymes: Secondary | ICD-10-CM | POA: Diagnosis not present

## 2022-04-05 LAB — COMPLETE METABOLIC PANEL WITH GFR
AG Ratio: 1.4 (calc) (ref 1.0–2.5)
ALT: 28 U/L (ref 6–29)
AST: 22 U/L (ref 10–30)
Albumin: 3.9 g/dL (ref 3.6–5.1)
Alkaline phosphatase (APISO): 47 U/L (ref 31–125)
BUN: 14 mg/dL (ref 7–25)
CO2: 27 mmol/L (ref 20–32)
Calcium: 9.1 mg/dL (ref 8.6–10.2)
Chloride: 105 mmol/L (ref 98–110)
Creat: 0.89 mg/dL (ref 0.50–0.99)
Globulin: 2.8 g/dL (calc) (ref 1.9–3.7)
Glucose, Bld: 85 mg/dL (ref 65–99)
Potassium: 4.4 mmol/L (ref 3.5–5.3)
Sodium: 140 mmol/L (ref 135–146)
Total Bilirubin: 0.5 mg/dL (ref 0.2–1.2)
Total Protein: 6.7 g/dL (ref 6.1–8.1)
eGFR: 83 mL/min/{1.73_m2} (ref >=60)

## 2022-04-07 NOTE — Progress Notes (Signed)
Liver looks great this time!!!! Recheck in 6 months.

## 2022-06-27 ENCOUNTER — Encounter: Payer: Self-pay | Admitting: Physician Assistant

## 2022-06-27 ENCOUNTER — Ambulatory Visit (INDEPENDENT_AMBULATORY_CARE_PROVIDER_SITE_OTHER): Payer: 59 | Admitting: Physician Assistant

## 2022-06-27 VITALS — BP 118/81 | HR 79 | Ht 63.0 in | Wt 151.0 lb

## 2022-06-27 DIAGNOSIS — F411 Generalized anxiety disorder: Secondary | ICD-10-CM

## 2022-06-27 DIAGNOSIS — Z23 Encounter for immunization: Secondary | ICD-10-CM | POA: Diagnosis not present

## 2022-06-27 DIAGNOSIS — E785 Hyperlipidemia, unspecified: Secondary | ICD-10-CM | POA: Diagnosis not present

## 2022-06-27 DIAGNOSIS — Z1329 Encounter for screening for other suspected endocrine disorder: Secondary | ICD-10-CM

## 2022-06-27 DIAGNOSIS — F331 Major depressive disorder, recurrent, moderate: Secondary | ICD-10-CM

## 2022-06-27 DIAGNOSIS — Z13220 Encounter for screening for lipoid disorders: Secondary | ICD-10-CM | POA: Diagnosis not present

## 2022-06-27 NOTE — Patient Instructions (Signed)
Abilify for adjunct treatment with effexor for major depression    Aripiprazole Tablets What is this medication? ARIPIPRAZOLE (ay ri PIP ray zole) treats schizophrenia, bipolar I disorder, autism spectrum disorder, and Tourette disorder. It may also be used with antidepressant medications to treat depression. It works by balancing the levels of dopamine and serotonin in the brain, hormones that help regulate mood, behaviors, and thoughts. It belongs to a group of medications called antipsychotics. Antipsychotics can be used to treat several kinds of mental health conditions. This medicine may be used for other purposes; ask your health care provider or pharmacist if you have questions. COMMON BRAND NAME(S): Abilify What should I tell my care team before I take this medication? They need to know if you have any of these conditions: Dementia Diabetes Difficulty swallowing Have trouble controlling your muscles Heart disease History of irregular heartbeat History of stroke Low blood cell levels (white cells, red cells, and platelets) Low blood pressure Parkinson disease Seizures Suicidal thoughts, plans, or attempt by you or a family member Urges to engage in impulsive behaviors in ways that are unusual for you An unusual or allergic reaction to aripiprazole, other medications, foods, dyes, or preservatives Pregnant or trying to get pregnant Breastfeeding How should I use this medication? Take this medication by mouth with a glass of water. Take it as directed on the prescription label at the same time every day. You can take it with or without food. If it upsets your stomach, take it with food. Do not take your medication more often than directed. Keep taking it unless your care team tells you to stop. A special MedGuide will be given to you by the pharmacist with each prescription and refill. Be sure to read this information carefully each time. Talk to your care team about the use of  this medication in children. While it may be prescribed for children as young as 6 years for selected conditions, precautions do apply. Overdosage: If you think you have taken too much of this medicine contact a poison control center or emergency room at once. NOTE: This medicine is only for you. Do not share this medicine with others. What if I miss a dose? If you miss a dose, take it as soon as you can. If it is almost time for your next dose, take only that dose. Do not take double or extra doses. What may interact with this medication? Do not take this medication with any of the following: Brexpiprazole Cisapride Dextromethorphan; quinidine Dronedarone Metoclopramide Pimozide Quinidine Thioridazine This medication may also interact with the following: Antihistamines for allergy, cough, and cold Carbamazepine Certain medications for anxiety or sleep Certain medications for depression, such as amitriptyline, fluoxetine, paroxetine, or sertraline Certain medications for fungal infections, such as fluconazole, itraconazole, ketoconazole, posaconazole, or voriconazole Clarithromycin General anesthetics, such as halothane, isoflurane, methoxyflurane, or propofol Medications for Parkinson disease, such as levodopa Medications for blood pressure Medications for seizures Medications that relax muscles for surgery Opioid medications for pain Other medications that cause heart rhythm changes Phenothiazines, such as chlorpromazine or prochlorperazine Rifampin This list may not describe all possible interactions. Give your health care provider a list of all the medicines, herbs, non-prescription drugs, or dietary supplements you use. Also tell them if you smoke, drink alcohol, or use illegal drugs. Some items may interact with your medicine. What should I watch for while using this medication? Visit your care team for regular checks on your progress. Tell your care team if your symptoms  do  not start to get better or if they get worse. Do not suddenly stop taking this medication. You may develop a severe reaction. Your care team will tell you how much medication to take. If your care team wants you to stop the medication, the dose may be slowly lowered over time to avoid any side effects. Patients and their families should watch out for new or worsening depression or thoughts of suicide. Also watch out for sudden changes in feelings such as feeling anxious, agitated, panicky, irritable, hostile, aggressive, impulsive, severely restless, overly excited and hyperactive, or not being able to sleep. If this happens, especially at the beginning of antidepressant treatment or after a change in dose, call your care team. This medication may affect your coordination, reaction time, or judgment. Do not drive or operate machinery until you know how this medication affects you. Sit up or stand slowly to reduce the risk of dizzy or fainting spells. Drinking alcohol with this medication can increase the risk of these side effects. This medication can cause problems with controlling your body temperature. It can lower the response of your body to cold temperatures. If possible, stay indoors during cold weather. If you must go outdoors, wear warm clothes. It can also lower the response of your body to heat. Do not overheat. Do not over-exercise. Stay out of the sun when possible. If you must be in the sun, wear cool clothing. Drink plenty of water. If you have trouble controlling your body temperature, call your care team right away. This medication may cause dry eyes and blurred vision. If you wear contact lenses, you may feel some discomfort. Lubricating eye drops may help. See your care team if the problem does not go away or is severe. This medication may increase blood sugar. Ask your care team if changes in diet or medications are needed if you have diabetes. There have been reports of increased sexual  urges or other strong urges such as gambling while taking this medication. If you experience any of these while taking this medication, you should report this to your care team as soon as possible. What side effects may I notice from receiving this medication? Side effects that you should report to your care team as soon as possible: Allergic reactions--skin rash, itching, hives, swelling of the face, lips, tongue, or throat High blood sugar (hyperglycemia)--increased thirst or amount of urine, unusual weakness or fatigue, blurry vision High fever, stiff muscles, increased sweating, fast or irregular heartbeat, and confusion, which may be signs of neuroleptic malignant syndrome Low blood pressure--dizziness, feeling faint or lightheaded, blurry vision Pain or trouble swallowing Prolonged or painful erection Seizures Stroke--sudden numbness or weakness of the face, arm, or leg, trouble speaking, confusion, trouble walking, loss of balance or coordination, dizziness, severe headache, change in vision Uncontrolled and repetitive body movements, muscle stiffness or spasms, tremors or shaking, loss of balance or coordination, restlessness, shuffling walk, which may be signs of extrapyramidal symptoms (EPS) Thoughts of suicide or self-harm, worsening mood, feelings of depression Urges to engage in impulsive behaviors such as gambling, binge eating, sexual activity, or shopping in ways that are unusual for you Side effects that usually do not require medical attention (report these to your care team if they continue or are bothersome): Constipation Drowsiness Weight gain This list may not describe all possible side effects. Call your doctor for medical advice about side effects. You may report side effects to FDA at 1-800-FDA-1088. Where should I keep my medication?  Keep out of the reach of children and pets. Store at room temperature between 15 and 30 degrees C (59 and 86 degrees F). Throw away any  unused medication after the expiration date. NOTE: This sheet is a summary. It may not cover all possible information. If you have questions about this medicine, talk to your doctor, pharmacist, or health care provider.  2023 Elsevier/Gold Standard (2020-09-06 00:00:00)

## 2022-06-27 NOTE — Progress Notes (Signed)
   Established Patient Office Visit  Subjective   Patient ID: Adriana Frazier, female    DOB: May 12, 1981  Age: 41 y.o. MRN: [id number]  Chief Complaint  Patient presents with  . Depression    anxiety    HPI  Effexor 225mg    {History (Optional):23778}  ROS    Objective:     BP 118/81   Pulse 79   Ht 5\' 3"  (1.6 m)   Wt 151 lb (68.5 kg)   SpO2 100%   BMI 26.75 kg/m  {Vitals History (Optional):23777}  Physical Exam   No results found for any visits on 06/27/22.  {Labs (Optional):23779}  The 10-year ASCVD risk score (Arnett DK, et al., 2019) is: 0.7%    Assessment & Plan:   Problem List Items Addressed This Visit       Unprioritized   Hyperlipidemia   Other Visit Diagnoses     Screening for lipid disorders    -  Primary   Thyroid disorder screen           No follow-ups on file.    13/10/23, PA-C

## 2022-06-30 ENCOUNTER — Encounter: Payer: Self-pay | Admitting: Physician Assistant

## 2022-07-04 ENCOUNTER — Encounter: Payer: Self-pay | Admitting: Physician Assistant

## 2022-07-04 MED ORDER — ARIPIPRAZOLE 2 MG PO TABS: 2.0000 mg | ORAL_TABLET | Freq: Every day | ORAL | 1 refills | Status: DC

## 2022-07-13 ENCOUNTER — Other Ambulatory Visit: Payer: Self-pay | Admitting: Physician Assistant

## 2022-07-13 DIAGNOSIS — Z789 Other specified health status: Secondary | ICD-10-CM

## 2022-07-24 ENCOUNTER — Ambulatory Visit: Payer: BC Managed Care – PPO | Admitting: Medical-Surgical

## 2022-08-05 ENCOUNTER — Other Ambulatory Visit: Payer: Self-pay | Admitting: Physician Assistant

## 2022-08-05 MED ORDER — ARIPIPRAZOLE 5 MG PO TABS: 5.0000 mg | ORAL_TABLET | Freq: Every day | ORAL | 0 refills | Status: DC

## 2022-08-05 MED ORDER — VENLAFAXINE HCL ER 150 MG PO CP24: 150.0000 mg | ORAL_CAPSULE | Freq: Every day | ORAL | 0 refills | Status: DC

## 2022-08-05 MED ORDER — VENLAFAXINE HCL ER 75 MG PO CP24: 75.0000 mg | ORAL_CAPSULE | Freq: Every day | ORAL | 0 refills | Status: DC

## 2022-08-05 NOTE — Progress Notes (Signed)
Needed refills. Abilify 2mg  no major improvement of symptoms. Increased to 5mg . Follow up in 1 month.

## 2022-08-06 ENCOUNTER — Ambulatory Visit (INDEPENDENT_AMBULATORY_CARE_PROVIDER_SITE_OTHER): Payer: 59 | Admitting: Physician Assistant

## 2022-08-06 ENCOUNTER — Encounter: Payer: Self-pay | Admitting: Physician Assistant

## 2022-08-06 VITALS — BP 116/85 | HR 98 | Ht 63.0 in | Wt 149.0 lb

## 2022-08-06 DIAGNOSIS — F411 Generalized anxiety disorder: Secondary | ICD-10-CM

## 2022-08-06 DIAGNOSIS — F331 Major depressive disorder, recurrent, moderate: Secondary | ICD-10-CM | POA: Diagnosis not present

## 2022-08-06 MED ORDER — BUPROPION HCL ER (XL) 150 MG PO TB24: 150.0000 mg | ORAL_TABLET | Freq: Every day | ORAL | 1 refills | Status: DC

## 2022-08-06 NOTE — Progress Notes (Signed)
Established Patient Office Visit  Subjective   Patient ID: Adriana Frazier, female    DOB: [birth year]  Age: 41 y.o. MRN: [id number]  Chief Complaint  Patient presents with   Follow-up    HPI Pt is a 41 yo female with MDD and GAD who presents to the clinic to follow up on mood. She does not like how she feels and feels like lack of motavation and pleasure is worsening. She did not feel any change with abilify except more tired. She has been on effexor since July with little benefit. She would like to go back on prozac and wellbutrin where she was a little numb but not down and no motavation. No SI/HC. She has talk therapy twice a month. Her husband does not have job and that is a Scientific laboratory technician.  .. 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   Moderate episode of recurrent major depressive disorder (HCC) 01/24/2022   Resolved Ambulatory Problems    Diagnosis Date Noted   Mass of left side of neck 08/04/2014   AMA (advanced maternal age) multigravida 35+ 05/25/2015   Supervision of other high risk pregnancies, first trimester 06/25/2015   Low lying placenta with hemorrhage, antepartum 08/11/2015   Ultrasound recheck of fetal pyelectasis, antepartum 11/25/2015   Preterm premature rupture of membranes (PPROM) with onset of labor within 24 hours of rupture in third trimester, antepartum 12/12/2015   SVD (spontaneous vaginal delivery) 12/12/2015   Tick bite 01/01/2016   Ringworm 01/31/2018    Neck tightness 01/18/2019   Past Medical History:  Diagnosis Date   Anxiety      ROS See HPI.    Objective:     BP 116/85   Pulse 98   Ht 5\' 3"  (1.6 m)   Wt 149 lb (67.6 kg)   SpO2 100%   BMI 26.39 kg/m  BP Readings from Last 3 Encounters:  08/06/22 116/85  06/27/22 118/81  01/23/22 115/82   Wt Readings from Last 3 Encounters:  08/06/22 149 lb (67.6 kg)  06/27/22 151 lb (68.5 kg)  01/23/22 169 lb (76.7 kg)    ..    08/06/2022   11:26 AM 01/22/2022    2:32 PM 07/22/2021   11:56 AM 07/04/2020    9:30 AM 01/02/2020   11:15 AM  Depression screen PHQ 2/9  Decreased Interest 3 2 1  0 0  Down, Depressed, Hopeless 3 2 1  0 0  PHQ - 2 Score 6 4 2  0 0  Altered sleeping 1 0 0 0 0  Tired, decreased energy 2 1 1 1  0  Change in appetite 1 0 1 0 0  Feeling bad or failure about yourself  3 2 0 0 0  Trouble concentrating 2 1 0 0 0  Moving slowly or fidgety/restless 2 0 1 0 0  Suicidal thoughts 1 0 0 0 0  PHQ-9 Score 18 8 5 1  0  Difficult doing work/chores Very difficult Somewhat difficult Somewhat difficult  Not difficult at all   .4/9  08/06/2022   11:27 AM 01/22/2022    2:32 PM 07/22/2021   11:56 AM 07/04/2020    9:31 AM  GAD 7 : Generalized Anxiety Score  Nervous, Anxious, on Edge 2 1 1  0  Control/stop worrying 0 1 1 0  Worry too much - different things 0 0 0 0  Trouble relaxing 2 1 1  0  Restless 0 0 0 0  Easily annoyed or irritable 0 1 1 0  Afraid - awful might happen 0 0 0 0  Total GAD 7 Score 4 4 4  0  Anxiety Difficulty  Not difficult at all Somewhat difficult Not difficult at all      Physical Exam Constitutional:      Appearance: Normal appearance. She is obese.  HENT:     Head: Normocephalic.  Cardiovascular:     Rate and Rhythm: Normal rate.  Pulmonary:     Effort: Pulmonary effort is normal.  Neurological:     Mental Status: She is alert.  Psychiatric:     Comments: tearful          Assessment & Plan:  Marland KitchenMarland KitchenSanaya was seen today for  follow-up.  Diagnoses and all orders for this visit:  Moderate episode of recurrent major depressive disorder (HCC) -     buPROPion (WELLBUTRIN XL) 150 MG 24 hr tablet; Take 1 tablet (150 mg total) by mouth daily.  Generalized anxiety disorder -     buPROPion (WELLBUTRIN XL) 150 MG 24 hr tablet; Take 1 tablet (150 mg total) by mouth daily.   PHQ 9 worsened to 18 No SI/HC She would like to try wellbutrin again Stop abilify  Start wellbutrin 150mg   Decrease effexor to 150mg   Follow up in 4 weeks Pt declined [place] referral at this time she does see counselor bi weekly  Iran Planas, PA-C

## 2022-08-06 NOTE — Patient Instructions (Addendum)
Stop abilify.  Start wellbutrin 150mg  daily in morning Decrease effexor to 150mg  daily in the morning.

## 2022-09-03 ENCOUNTER — Ambulatory Visit (INDEPENDENT_AMBULATORY_CARE_PROVIDER_SITE_OTHER): Payer: 59 | Admitting: Physician Assistant

## 2022-09-03 ENCOUNTER — Encounter: Payer: Self-pay | Admitting: Physician Assistant

## 2022-09-03 VITALS — BP 116/78 | HR 96 | Temp 98.8°F | Ht 63.0 in | Wt 150.0 lb

## 2022-09-03 DIAGNOSIS — K50819 Crohn's disease of both small and large intestine with unspecified complications: Secondary | ICD-10-CM

## 2022-09-03 DIAGNOSIS — R52 Pain, unspecified: Secondary | ICD-10-CM | POA: Diagnosis not present

## 2022-09-03 DIAGNOSIS — F411 Generalized anxiety disorder: Secondary | ICD-10-CM

## 2022-09-03 DIAGNOSIS — F331 Major depressive disorder, recurrent, moderate: Secondary | ICD-10-CM

## 2022-09-03 LAB — POC COVID19 BINAXNOW: SARS Coronavirus 2 Ag: NEGATIVE

## 2022-09-03 LAB — POCT INFLUENZA A/B
Influenza A, POC: NEGATIVE
Influenza B, POC: NEGATIVE

## 2022-09-03 MED ORDER — BUPROPION HCL ER (XL) 300 MG PO TB24: 300.0000 mg | ORAL_TABLET | Freq: Every day | ORAL | 0 refills | Status: DC

## 2022-09-03 MED ORDER — VENLAFAXINE HCL ER 150 MG PO CP24
150.0000 mg | ORAL_CAPSULE | Freq: Every day | ORAL | 0 refills | Status: DC
Start: 2022-09-03 — End: 2022-11-18

## 2022-09-03 NOTE — Patient Instructions (Signed)
Increase wellbutrin to 300mg  and effexor 150mg .

## 2022-09-03 NOTE — Progress Notes (Signed)
Established Patient Office Visit  Subjective   Patient ID: Adriana Frazier, female    DOB: Jul 02, 1981  Age: 42 y.o. MRN: [id number]  Chief Complaint  Patient presents with   Follow-up    HPI Pt is a 42 yo female who presents to the clinic to follow up on MDD and GAD. She is now on effexor and wellbutrin and doing better. She is not exactly where she wants to be but better than before. She continues to work and feels like she has more to contribute at this time. She denies any SI/HC. She has more motivation and positive outlook for the future.   She has started with some GI symptoms that feel like her UC/Chrons is flaring. She went off her preventative medication due to cost but is going to start it back. She is having loose stools with mucus. No significant abdominal pain at this time.  Her son has been sick and she just started with some body aches this morning. She wants to make sure she does not have flu or covid.    .. 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   Moderate episode of recurrent major depressive disorder ([place]) 01/24/2022   Resolved Ambulatory Problems    Diagnosis Date Noted   Mass of left side of neck 08/04/2014   AMA (advanced maternal age) multigravida 35+ 05/25/2015   Supervision of other high risk pregnancies, first trimester 06/25/2015   Low lying placenta with hemorrhage, antepartum 08/11/2015   Ultrasound recheck of fetal pyelectasis, antepartum  11/25/2015   Preterm premature rupture of membranes (PPROM) with onset of labor within 24 hours of rupture in third trimester, antepartum 12/12/2015   SVD (spontaneous vaginal delivery) 12/12/2015   Tick bite 01/01/2016   Ringworm 01/31/2018   Neck tightness 01/18/2019   Past Medical History:  Diagnosis Date   Anxiety       ROS See HPI.    Objective:     BP 116/78   Pulse 96   Temp 98.8 F (37.1 C) (Oral)   Ht 5\' 3"  (1.6 m)   Wt 150 lb (68 kg)   SpO2 98%   BMI 26.57 kg/m  BP Readings from Last 3 Encounters:  09/03/22 116/78  08/06/22 116/85  06/27/22 118/81   Wt Readings from Last 3 Encounters:  09/03/22 150 lb (68 kg)  08/06/22 149 lb (67.6 kg)  06/27/22 151 lb (68.5 kg)       .[name]    09/03/2022    2:26 PM 08/06/2022   11:26 AM 01/22/2022    2:32 PM 07/22/2021   11:56 AM 07/04/2020    9:30 AM  Depression screen PHQ 2/9  Decreased Interest 1 3 2 1  0  Down, Depressed, Hopeless 2 3 2 1  0  PHQ - 2 Score 3 6 4 2  0  Altered sleeping 0 1 0 0 0  Tired, decreased energy 1 2 1 1 1   Change in appetite 0 1 0 1 0  Feeling bad or failure about  yourself  2 3 2  0 0  Trouble concentrating 0 2 1 0 0  Moving slowly or fidgety/restless 0 2 0 1 0  Suicidal thoughts 0 1 0 0 0  PHQ-9 Score 6 18 8 5 1   Difficult doing work/chores Somewhat difficult Very difficult Somewhat difficult Somewhat difficult    .[name]    09/03/2022    2:27 PM 08/06/2022   11:27 AM 01/22/2022    2:32 PM 07/22/2021   11:56 AM  GAD 7 : Generalized Anxiety Score  Nervous, Anxious, on Edge 2 2 1 1   Control/stop worrying 1 0 1 1  Worry too much - different things 1 0 0 0  Trouble relaxing 1 2 1 1   Restless 1 0 0 0  Easily annoyed or irritable 0 0 1 1  Afraid - awful might happen 0 0 0 0  Total GAD 7 Score 6 4 4 4   Anxiety Difficulty Somewhat difficult  Not difficult at all Somewhat difficult   .[name] Results for orders placed or performed in visit on 09/03/22  POC COVID-19  Result Value Ref Range    SARS Coronavirus 2 Ag Negative Negative  POCT Influenza A/B  Result Value Ref Range   Influenza A, POC Negative Negative   Influenza B, POC Negative Negative     Physical Exam Constitutional:      Appearance: Normal appearance.  Cardiovascular:     Rate and Rhythm: Normal rate and regular rhythm.  Pulmonary:     Effort: Pulmonary effort is normal.  Abdominal:     General: Bowel sounds are normal. There is no distension.     Palpations: Abdomen is soft. There is no mass.     Tenderness: There is no abdominal tenderness. There is no guarding or rebound.  Neurological:     General: No focal deficit present.     Mental Status: She is alert and oriented to person, place, and time.  Psychiatric:     Comments: Flat affect          Assessment & Plan:  Marland KitchenMarland KitchenRosmary was seen today for follow-up.  Diagnoses and all orders for this visit:  Moderate episode of recurrent major depressive disorder (HCC) -     buPROPion (WELLBUTRIN XL) 300 MG 24 hr tablet; Take 1 tablet (300 mg total) by mouth daily. -     venlafaxine XR (EFFEXOR-XR) 150 MG 24 hr capsule; Take 1 capsule (150 mg total) by mouth daily.  Body aches -     POC COVID-19 -     POCT Influenza A/B  Generalized anxiety disorder -     buPROPion (WELLBUTRIN XL) 300 MG 24 hr tablet; Take 1 tablet (300 mg total) by mouth daily. -     venlafaxine XR (EFFEXOR-XR) 150 MG 24 hr capsule; Take 1 capsule (150 mg total) by mouth daily.  Crohn's disease of small and large intestines with complication (HCC) -     mesalamine (LIALDA) 1.2 g EC tablet; Take 2 tablets (2.4 g total) by mouth daily with breakfast. 2 tabs   Negative for flu  or covid Symptomatic care discussed Follow up as needed  PHQ improved greatly Increased wellbutrin and keep effexor the same Follow up in 4-6 weeks  Ok to add back in mesalamine and follow up as needed or if symptoms do not resolve    Adriana Planas, PA-C

## 2022-09-05 MED ORDER — MESALAMINE 1.2 G PO TBEC
2.4000 g | DELAYED_RELEASE_TABLET | Freq: Every day | ORAL | Status: AC
Start: 2022-09-05 — End: ?

## 2022-10-24 ENCOUNTER — Other Ambulatory Visit: Payer: Self-pay | Admitting: Physician Assistant

## 2022-10-24 DIAGNOSIS — Z9289 Personal history of other medical treatment: Secondary | ICD-10-CM

## 2022-11-18 ENCOUNTER — Other Ambulatory Visit: Payer: Self-pay | Admitting: Physician Assistant

## 2022-11-18 DIAGNOSIS — F331 Major depressive disorder, recurrent, moderate: Secondary | ICD-10-CM

## 2022-11-18 DIAGNOSIS — F411 Generalized anxiety disorder: Secondary | ICD-10-CM

## 2022-11-23 ENCOUNTER — Encounter: Payer: Self-pay | Admitting: Physician Assistant

## 2022-12-03 ENCOUNTER — Ambulatory Visit: Payer: 59 | Admitting: Physician Assistant

## 2022-12-03 ENCOUNTER — Encounter: Payer: Self-pay | Admitting: Physician Assistant

## 2022-12-03 VITALS — BP 119/77 | HR 64 | Ht 63.0 in | Wt 165.0 lb

## 2022-12-03 DIAGNOSIS — F331 Major depressive disorder, recurrent, moderate: Secondary | ICD-10-CM

## 2022-12-03 DIAGNOSIS — R635 Abnormal weight gain: Secondary | ICD-10-CM

## 2022-12-03 DIAGNOSIS — F411 Generalized anxiety disorder: Secondary | ICD-10-CM

## 2022-12-03 NOTE — Progress Notes (Signed)
Established Patient Office Visit  Subjective   Patient ID: Adriana Frazier, female    DOB: 1981/03/23  Age: 42 y.o. MRN: [id number]  Chief Complaint  Patient presents with   Follow-up    HPI Patient is a 42 year old female with depression and anxiety who presents to the clinic for follow-up.  She is on Effexor and Wellbutrin and doing well.  For the last 3 months she has been much happier.  She is working well and able to keep up and be focused.  She denies any problems sleeping.  She denies any suicidal thoughts or homicidal idealizations.  Her husband did get a job which helped with the financial stress.  She has noticed some weight gain since starting Effexor.  She admits she is not exercising or dieting.  She had a few episodes of blood in stool but they have resolved and has colonoscopy in June.   .. 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   Moderate episode of recurrent major depressive disorder 01/24/2022   Weight gain 12/03/2022   Resolved Ambulatory Problems    Diagnosis Date Noted   Mass of left side of neck 08/04/2014   AMA (advanced maternal age) multigravida 35+ 05/25/2015   Supervision of other high risk pregnancies, first trimester 06/25/2015   Low lying placenta with hemorrhage, antepartum 08/11/2015   Ultrasound recheck of fetal pyelectasis, antepartum 11/25/2015   Preterm premature rupture of membranes (PPROM) with onset of labor within 24 hours of rupture in third  trimester, antepartum 12/12/2015   SVD (spontaneous vaginal delivery) 12/12/2015   Tick bite 01/01/2016   Ringworm 01/31/2018   Neck tightness 01/18/2019   Past Medical History:  Diagnosis Date   Anxiety      ROS See HPI>    Objective:     BP 119/77   Pulse 64   Ht  (1.6 m)   Wt 165 lb (74.8 kg)   SpO2 99%   BMI 29.23 kg/m  BP Readings from Last 3 Encounters:  12/03/22 119/77  09/03/22 116/78  08/06/22 116/85   Wt Readings from Last 3 Encounters:  12/03/22 165 lb (74.8 kg)  09/03/22 150 lb (68 kg)  08/06/22 149 lb (67.6 kg)   ..    12/04/2022    7:18 AM 09/03/2022    2:26 PM 08/06/2022   11:26 AM 01/22/2022    2:32 PM 07/22/2021   11:56 AM  Depression screen PHQ 2/9  Decreased Interest Down, Depressed, Hopeless PHQ - 2 Score Altered sleeping 1 0 1 0 0  Tired, decreased energy Change in appetite 2 0 1 0 1  Feeling bad or failure about yourself  0  Trouble concentrating 2 0 2 1 0  Moving slowly or fidgety/restless 0 0 2 0 1  Suicidal thoughts 0 0 1 0 0  PHQ-9 Score 11 6 18  8 5  Difficult doing work/chores Somewhat difficult Somewhat difficult Very difficult Somewhat difficult Somewhat difficult   .[name]    12/04/2022    7:18 AM 09/03/2022    2:27 PM 08/06/2022   11:27 AM 01/22/2022    2:32 PM  GAD 7 : Generalized Anxiety Score  Nervous, Anxious, on Edge Control/stop worrying 1 1 0 1  Worry too much - different things 2 1 0 0  Trouble relaxing Restless 1 1 0 0  Easily annoyed or irritable 2 0 0 1  Afraid - awful might happen 0 0 0 0  Total GAD 7 Score Anxiety Difficulty Somewhat difficult Somewhat difficult  Not difficult at all        Physical Exam Constitutional:      Appearance: Normal appearance.  HENT:     Head: Normocephalic.  Cardiovascular:     Rate and Rhythm: Normal rate and regular rhythm.  Pulmonary:     Effort: Pulmonary effort is normal.   Neurological:     Mental Status: She is alert.     Assessment & Plan:  Marland KitchenMarland KitchenEmmalin was seen today for follow-up.  Diagnoses and all orders for this visit:  Moderate episode of recurrent major depressive disorder -     COMPLETE METABOLIC PANEL WITH GFR  Generalized anxiety disorder -     COMPLETE METABOLIC PANEL WITH GFR  Weight gain   GAD/PHQ improved and doing well Stay on same dose of effexor and wellbutrin Noted some weight gain Try to increase exercise and healthy movement Resolved blood in stool and no more abnormal cramping or symptoms, colonoscopy scheduled for June.    Tandy Gaw, PA-C

## 2022-12-04 LAB — COMPLETE METABOLIC PANEL WITH GFR
AG Ratio: 1.5 (calc) (ref 1.0–2.5)
ALT: 22 U/L (ref 6–29)
AST: 21 U/L (ref 10–30)
Albumin: 4 g/dL (ref 3.6–5.1)
Alkaline phosphatase (APISO): 41 U/L (ref 31–125)
BUN: 16 mg/dL (ref 7–25)
CO2: 27 mmol/L (ref 20–32)
Calcium: 8.9 mg/dL (ref 8.6–10.2)
Chloride: 101 mmol/L (ref 98–110)
Creat: 0.9 mg/dL (ref 0.50–0.99)
Globulin: 2.6 g/dL (calc) (ref 1.9–3.7)
Glucose, Bld: 81 mg/dL (ref 65–99)
Potassium: 4 mmol/L (ref 3.5–5.3)
Sodium: 138 mmol/L (ref 135–146)
Total Bilirubin: 0.5 mg/dL (ref 0.2–1.2)
Total Protein: 6.6 g/dL (ref 6.1–8.1)
eGFR: 82 mL/min/{1.73_m2} (ref >=60)

## 2022-12-05 ENCOUNTER — Encounter: Payer: Self-pay | Admitting: Physician Assistant

## 2022-12-05 NOTE — Progress Notes (Signed)
Kidney, liver, glucose look perfect.

## 2022-12-06 ENCOUNTER — Other Ambulatory Visit: Payer: Self-pay | Admitting: Physician Assistant

## 2022-12-06 DIAGNOSIS — Z789 Other specified health status: Secondary | ICD-10-CM

## 2022-12-10 ENCOUNTER — Ambulatory Visit (INDEPENDENT_AMBULATORY_CARE_PROVIDER_SITE_OTHER): Payer: 59

## 2022-12-10 ENCOUNTER — Ambulatory Visit: Payer: 59

## 2022-12-10 DIAGNOSIS — Z9289 Personal history of other medical treatment: Secondary | ICD-10-CM

## 2022-12-10 DIAGNOSIS — Z1231 Encounter for screening mammogram for malignant neoplasm of breast: Secondary | ICD-10-CM | POA: Diagnosis not present

## 2022-12-12 NOTE — Progress Notes (Signed)
Normal mammogram. Follow up in 1 year.

## 2022-12-30 ENCOUNTER — Ambulatory Visit: Payer: 59 | Admitting: Physician Assistant

## 2022-12-31 ENCOUNTER — Ambulatory Visit: Payer: 59 | Admitting: Physician Assistant

## 2023-01-20 LAB — HM COLONOSCOPY

## 2023-02-02 IMAGING — MG MM DIGITAL SCREENING BILAT W/ TOMO AND CAD
8 series · 8 of 24 positions shown · non-contrast
Comparison: Previous exam(s).

CLINICAL DATA: Screening.

EXAM:
DIGITAL SCREENING BILATERAL MAMMOGRAM WITH TOMOSYNTHESIS AND CAD
TECHNIQUE: Bilateral screening digital craniocaudal and mediolateral oblique
mammograms were obtained. Bilateral screening digital breast
tomosynthesis was performed. The images were evaluated with
computer-aided detection.

[R MLO synth-2D]
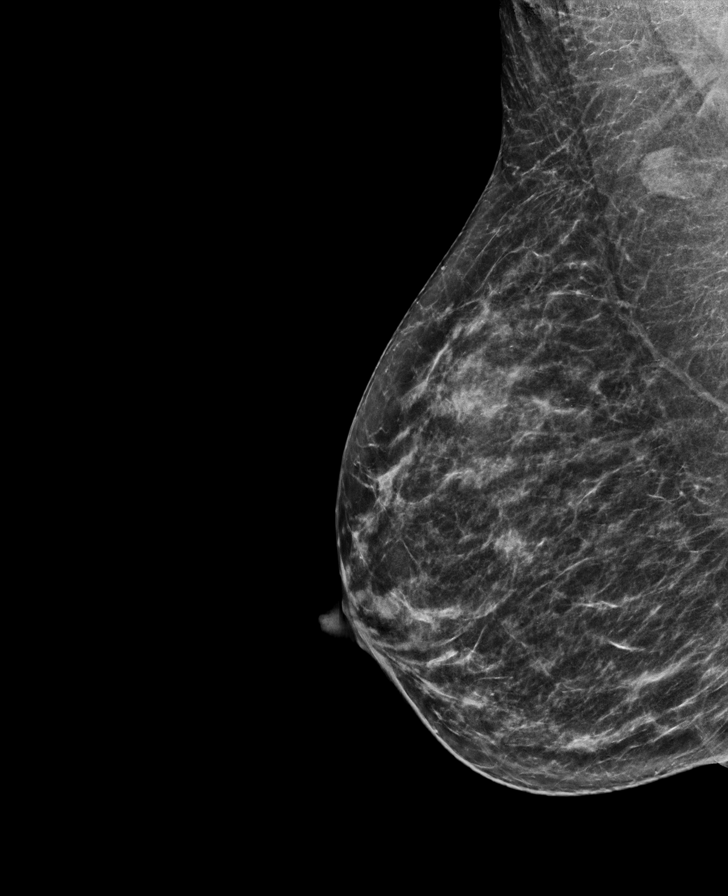

[R CC synth-2D]
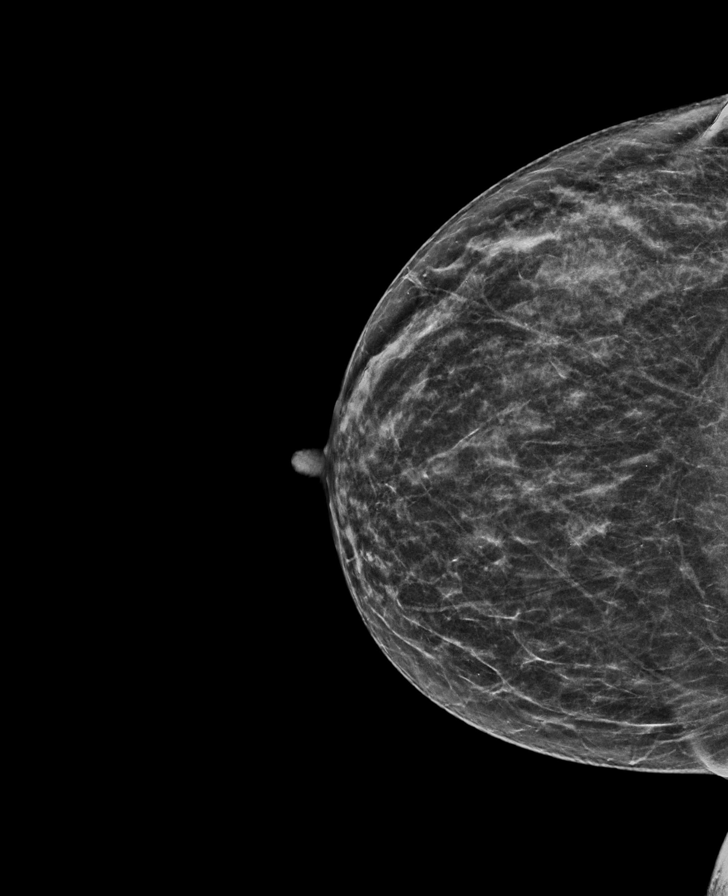

[L MLO synth-2D]
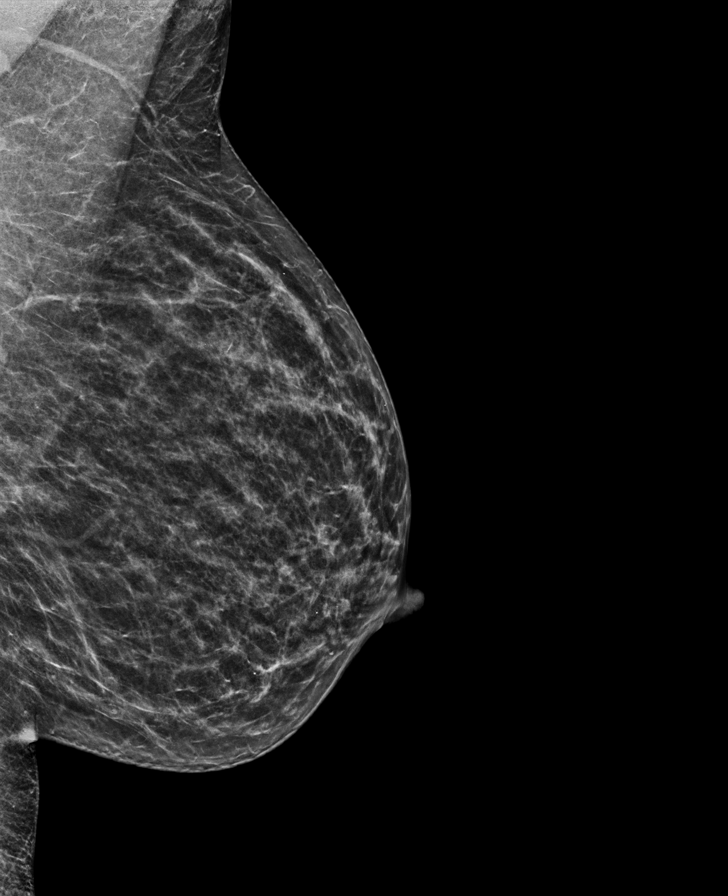

[L CC synth-2D]
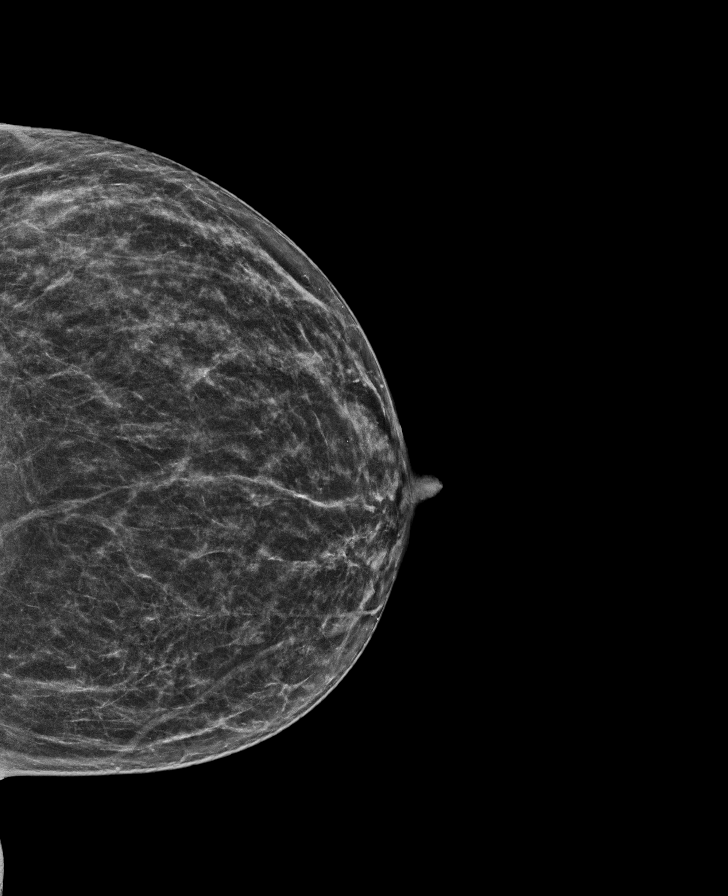

[R MLO tomo · tomo slice 31/62.0]
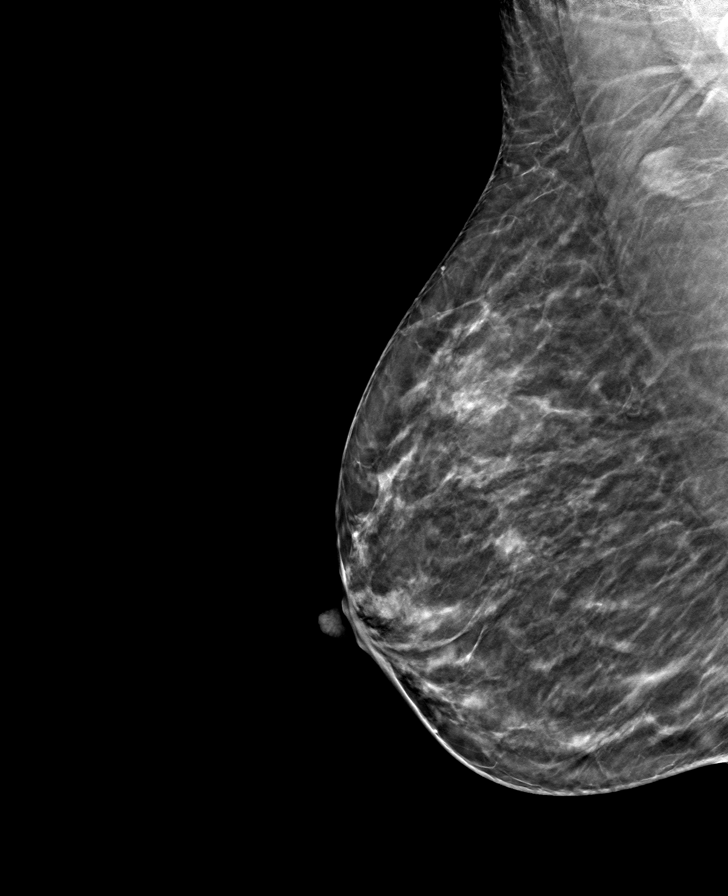

[R CC tomo · tomo slice 29/56.0]
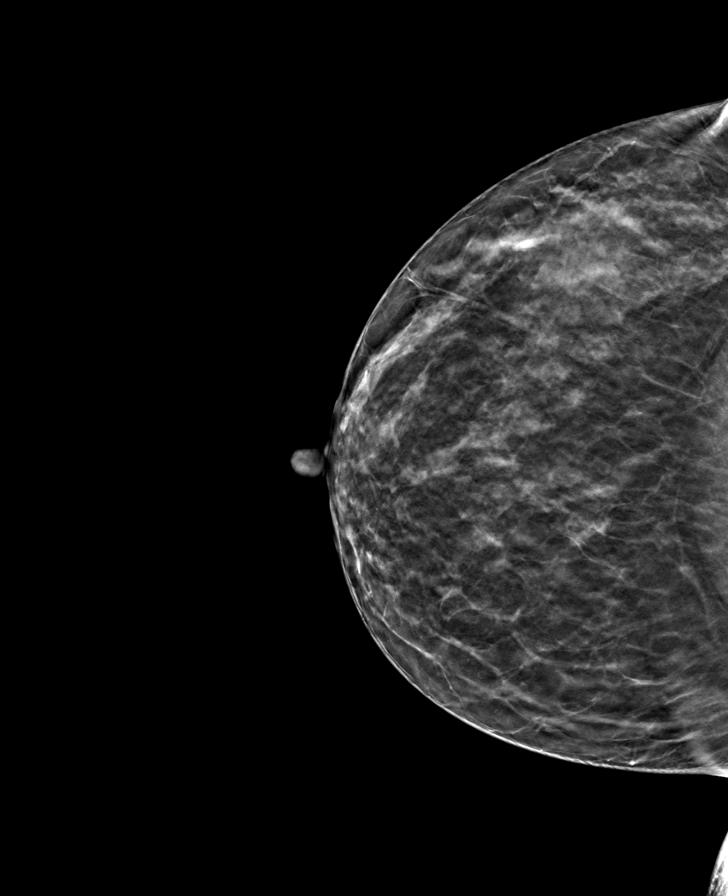

[L CC tomo · tomo slice 29/56.0]
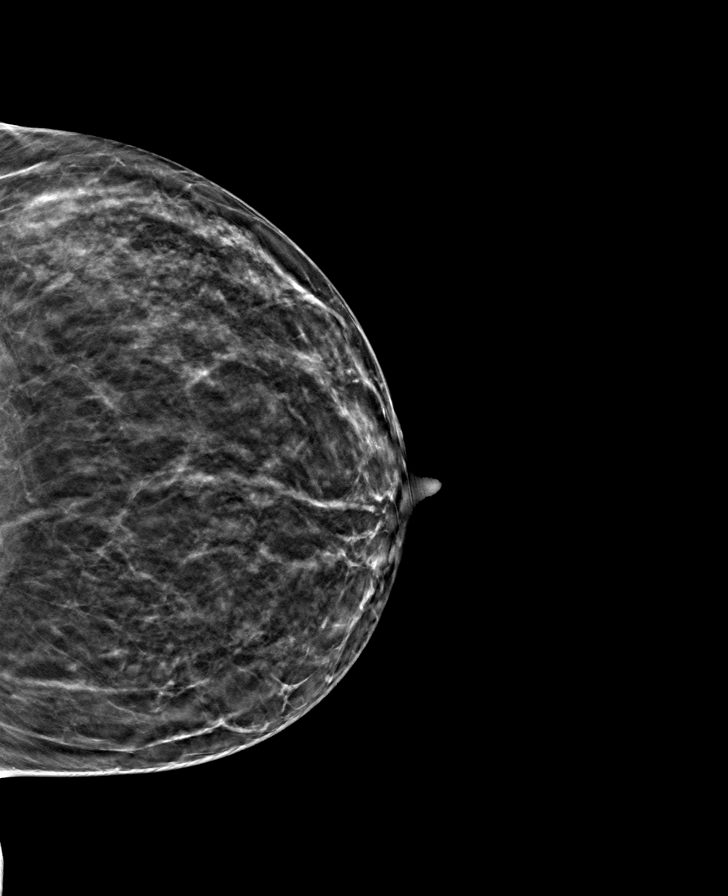

[L MLO tomo · tomo slice 31/60.0]
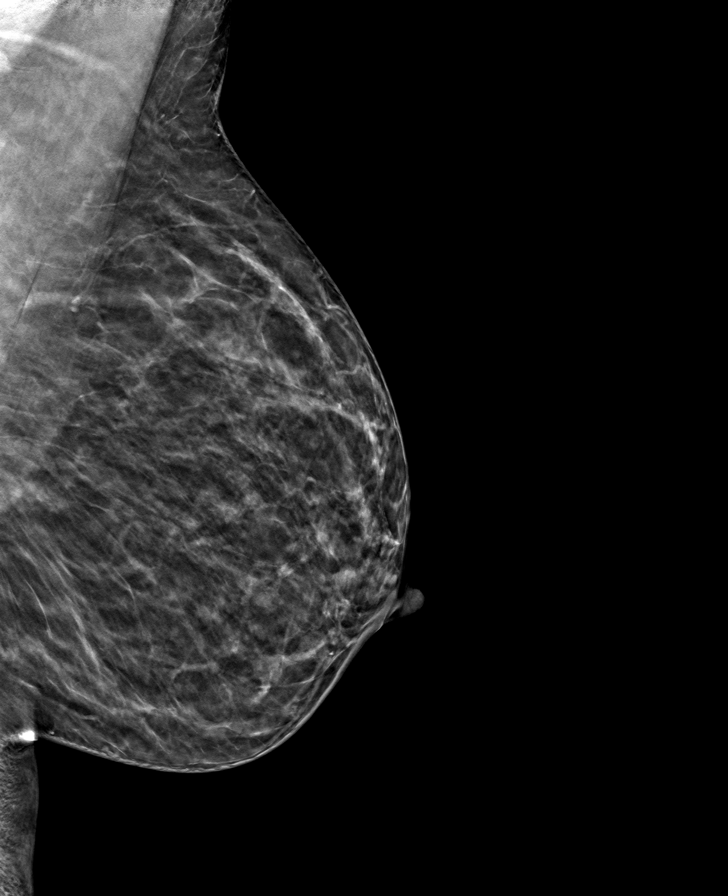

[8 of 24 positions shown; findings below may reference images not displayed]

ACR Breast Density Category b: There are scattered areas of
fibroglandular density.
FINDINGS: There are no findings suspicious for malignancy.
IMPRESSION: No mammographic evidence of malignancy. A result letter of this
screening mammogram will be mailed directly to the patient.

RECOMMENDATION:
Screening mammogram in one year. (Code:51-O-LD2)

BI-RADS CATEGORY  1: Negative.

## 2023-02-06 ENCOUNTER — Other Ambulatory Visit: Payer: Self-pay | Admitting: Physician Assistant

## 2023-02-06 DIAGNOSIS — F411 Generalized anxiety disorder: Secondary | ICD-10-CM

## 2023-02-06 DIAGNOSIS — F331 Major depressive disorder, recurrent, moderate: Secondary | ICD-10-CM

## 2023-02-09 MED ORDER — VENLAFAXINE HCL ER 150 MG PO CP24
150.0000 mg | ORAL_CAPSULE | Freq: Every day | ORAL | 0 refills | Status: DC
Start: 2023-02-09 — End: 2023-05-13

## 2023-02-09 MED ORDER — BUPROPION HCL ER (XL) 300 MG PO TB24
300.0000 mg | ORAL_TABLET | Freq: Every day | ORAL | 0 refills | Status: DC
Start: 2023-02-09 — End: 2023-05-13

## 2023-02-17 ENCOUNTER — Encounter: Payer: Self-pay | Admitting: Physician Assistant

## 2023-02-17 DIAGNOSIS — K635 Polyp of colon: Secondary | ICD-10-CM | POA: Insufficient documentation

## 2023-03-02 ENCOUNTER — Encounter: Payer: Self-pay | Admitting: Physician Assistant

## 2023-05-04 ENCOUNTER — Other Ambulatory Visit: Payer: Self-pay | Admitting: Physician Assistant

## 2023-05-04 DIAGNOSIS — F331 Major depressive disorder, recurrent, moderate: Secondary | ICD-10-CM

## 2023-05-04 DIAGNOSIS — F411 Generalized anxiety disorder: Secondary | ICD-10-CM

## 2023-05-25 ENCOUNTER — Encounter: Payer: Self-pay | Admitting: Physician Assistant

## 2023-05-25 ENCOUNTER — Ambulatory Visit (INDEPENDENT_AMBULATORY_CARE_PROVIDER_SITE_OTHER): Payer: 59 | Admitting: Physician Assistant

## 2023-05-25 VITALS — BP 114/77 | HR 73 | Ht 63.0 in | Wt 189.8 lb

## 2023-05-25 DIAGNOSIS — Z13220 Encounter for screening for lipoid disorders: Secondary | ICD-10-CM

## 2023-05-25 DIAGNOSIS — F411 Generalized anxiety disorder: Secondary | ICD-10-CM

## 2023-05-25 DIAGNOSIS — E66811 Obesity, class 1: Secondary | ICD-10-CM | POA: Diagnosis not present

## 2023-05-25 DIAGNOSIS — F331 Major depressive disorder, recurrent, moderate: Secondary | ICD-10-CM

## 2023-05-25 DIAGNOSIS — E559 Vitamin D deficiency, unspecified: Secondary | ICD-10-CM

## 2023-05-25 DIAGNOSIS — Z23 Encounter for immunization: Secondary | ICD-10-CM | POA: Diagnosis not present

## 2023-05-25 DIAGNOSIS — Z131 Encounter for screening for diabetes mellitus: Secondary | ICD-10-CM

## 2023-05-25 DIAGNOSIS — Z6833 Body mass index (BMI) 33.0-33.9, adult: Secondary | ICD-10-CM

## 2023-05-25 DIAGNOSIS — E6609 Other obesity due to excess calories: Secondary | ICD-10-CM

## 2023-05-25 MED ORDER — BUPROPION HCL ER (XL) 300 MG PO TB24: 300.0000 mg | ORAL_TABLET | Freq: Every day | ORAL | 3 refills | Status: DC

## 2023-05-25 MED ORDER — VENLAFAXINE HCL ER 150 MG PO CP24
150.0000 mg | ORAL_CAPSULE | Freq: Every day | ORAL | 3 refills | Status: DC
Start: 2023-05-25 — End: 2023-09-02

## 2023-05-25 NOTE — Progress Notes (Signed)
Established Patient Office Visit  Subjective   Patient ID: Adriana Frazier, female    DOB: Mar 19, 1981  Age: 42 y.o. MRN: [id number]  Chief Complaint  Patient presents with   Depression    6 month follow up.    HPI Pt is a 42 yo obese female with MDD, GAD who presents to the clinic for 6 month follow up.   Her mood is doing great. She is doing well with wellbutrin and effexor. No concerns. No SI/HC. Her job is going well. She is very happy.   She is trying to lose weight. She is working the program of simple app on her phone and trying intermittent fasting. She is exercising 30 minutes a day.   .. 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   Moderate episode of recurrent major depressive disorder (HCC) 01/24/2022   Weight gain 12/03/2022   Colon polyp 02/17/2023   Resolved Ambulatory Problems    Diagnosis Date Noted   Mass of left side of neck 08/04/2014   AMA (advanced maternal age) multigravida 35+ 05/25/2015   Supervision of other high risk pregnancies, first trimester 06/25/2015   Low lying placenta with hemorrhage, antepartum 08/11/2015   Ultrasound recheck of fetal pyelectasis, antepartum 11/25/2015   Preterm premature rupture of membranes (PPROM) with onset of labor within 24 hours of rupture in third trimester, antepartum 12/12/2015   SVD (spontaneous vaginal delivery) 12/12/2015   Tick bite 01/01/2016   Ringworm 01/31/2018   Neck tightness 01/18/2019   Past Medical  History:  Diagnosis Date   Anxiety      ROS See HPI.    Objective:     BP 114/77   Pulse 73   Ht 5\' 3"  (1.6 m)   Wt 189 lb 12 oz (86.1 kg)   SpO2 99%   BMI 33.61 kg/m  BP Readings from Last 3 Encounters:  05/25/23 114/77  12/03/22 119/77  09/03/22 116/78   Wt Readings from Last 3 Encounters:  05/25/23 189 lb 12 oz (86.1 kg)  12/03/22 165 lb (74.8 kg)  09/03/22 150 lb (68 kg)    .[name]    05/25/2023    2:10 PM 12/04/2022    7:18 AM 09/03/2022    2:26 PM 08/06/2022   11:26 AM 01/22/2022    2:32 PM  Depression screen PHQ 2/9  Decreased Interest 0 2 1 3 2   Down, Depressed, Hopeless 0 1 2 3 2   PHQ - 2 Score 0 3 3 6 4   Altered sleeping 0 1 0 1 0  Tired, decreased energy 0 1 1 2 1   Change in appetite 0 2 0 1 0  Feeling bad or failure about yourself  0 2 2 3 2   Trouble concentrating 0 2 0 2 1  Moving slowly or fidgety/restless 0 0 0 2 0  Suicidal thoughts 0 0 0 1 0  PHQ-9 Score 0 11 6 18 8   Difficult doing work/chores Not difficult at all Somewhat difficult Somewhat difficult Very difficult Somewhat difficult   .[name]  05/25/2023    2:10 PM 12/04/2022    7:18 AM 09/03/2022    2:27 PM 08/06/2022   11:27 AM  GAD 7 : Generalized Anxiety Score  Nervous, Anxious, on Edge 0 1 2 2   Control/stop worrying 0 1 1 0  Worry too much - different things 0 2 1 0  Trouble relaxing 0 1 1 2   Restless 0 1 1 0  Easily annoyed or irritable 0 2 0 0  Afraid - awful might happen 0 0 0 0  Total GAD 7 Score 0 8 6 4   Anxiety Difficulty Not difficult at all Somewhat difficult Somewhat difficult       Physical Exam Constitutional:      Appearance: Normal appearance. She is obese.  HENT:     Head: Normocephalic.  Cardiovascular:     Rate and Rhythm: Normal rate and regular rhythm.  Pulmonary:     Effort: Pulmonary effort is normal.     Breath sounds: Normal breath sounds.  Neurological:     General: No focal deficit present.     Mental Status: She is alert and oriented to person,  place, and time.  Psychiatric:        Mood and Affect: Mood normal.        The 10-year ASCVD risk score (Arnett DK, et al., 2019) is: 0.7%    Assessment & Plan:  Marland KitchenMarland KitchenSayaka was seen today for depression.  Diagnoses and all orders for this visit:  Moderate episode of recurrent major depressive disorder (HCC) -     venlafaxine XR (EFFEXOR-XR) 150 MG 24 hr capsule; Take 1 capsule (150 mg total) by mouth daily. -     buPROPion (WELLBUTRIN XL) 300 MG 24 hr tablet; Take 1 tablet (300 mg total) by mouth daily.  Generalized anxiety disorder -     venlafaxine XR (EFFEXOR-XR) 150 MG 24 hr capsule; Take 1 capsule (150 mg total) by mouth daily. -     buPROPion (WELLBUTRIN XL) 300 MG 24 hr tablet; Take 1 tablet (300 mg total) by mouth daily.  Screening for diabetes mellitus -     CMP14+EGFR  Screening for lipid disorders -     Lipid panel  Class 1 obesity due to excess calories without serious comorbidity with body mass index (BMI) of 33.0 to 33.9 in adult -     TSH  Vitamin D insufficiency -     VITAMIN D 25 Hydroxy (Vit-D Deficiency, Fractures)  Encounter for immunization -     Flu vaccine trivalent PF, 6mos and older(Flulaval,Afluria,Fluarix,Fluzone) -     Pfizer Comirnaty Covid-19 Vaccine 45yrs & older   Will get fasting labs, come back to have drawn.   [name].Discussed low carb diet with 1500 calories and 80g of protein.  Exercising at least 150 minutes a week.  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  Discussed medication options HO given for intermittent fasting 16 to 8  PHQ/GAD look great Refilled wellbutrin and effexor  FLU and covid vaccine done today.    Tandy Gaw, PA-C

## 2023-06-05 ENCOUNTER — Ambulatory Visit: Payer: 59 | Admitting: Physician Assistant

## 2023-06-13 LAB — CMP14+EGFR
ALT: 24 [IU]/L (ref 0–32)
AST: 26 [IU]/L (ref 0–40)
Albumin: 4.2 g/dL (ref 3.9–4.9)
Alkaline Phosphatase: 54 [IU]/L (ref 44–121)
BUN/Creatinine Ratio: 16 (ref 9–23)
BUN: 12 mg/dL (ref 6–24)
Bilirubin Total: 0.5 mg/dL (ref 0.0–1.2)
CO2: 25 mmol/L (ref 20–29)
Calcium: 9 mg/dL (ref 8.7–10.2)
Chloride: 99 mmol/L (ref 96–106)
Creatinine, Ser: 0.77 mg/dL (ref 0.57–1.00)
Globulin, Total: 2.8 g/dL (ref 1.5–4.5)
Glucose: 68 mg/dL — ABNORMAL LOW (ref 70–99)
Potassium: 4.1 mmol/L (ref 3.5–5.2)
Sodium: 139 mmol/L (ref 134–144)
Total Protein: 7 g/dL (ref 6.0–8.5)
eGFR: 98 mL/min/{1.73_m2} (ref >59)

## 2023-06-13 LAB — TSH: TSH: 0.603 u[IU]/mL (ref 0.450–4.500)

## 2023-06-13 LAB — LIPID PANEL
Chol/HDL Ratio: 4.4 {ratio} (ref 0.0–4.4)
Cholesterol, Total: 319 mg/dL — ABNORMAL HIGH (ref 100–199)
HDL: 72 mg/dL (ref >39)
LDL Chol Calc (NIH): 224 mg/dL — ABNORMAL HIGH (ref 0–99)
Triglycerides: 130 mg/dL (ref 0–149)
VLDL Cholesterol Cal: 23 mg/dL (ref 5–40)

## 2023-06-13 LAB — VITAMIN D 25 HYDROXY (VIT D DEFICIENCY, FRACTURES): Vit D, 25-Hydroxy: 88.5 ng/mL (ref 30.0–100.0)

## 2023-06-15 ENCOUNTER — Encounter: Payer: Self-pay | Admitting: Physician Assistant

## 2023-06-15 NOTE — Progress Notes (Signed)
Charese,   Thyroid normal range.  Vitamin D looks GREAT! Kidney and liver look wonderful.  Glucose is a little on low side. Make sure eating frequent small meals to keep blood sugar stable.   HDL, good cholesterol, looks amazing.  LDL, bad cholesterol, is very elevated.   Your 10 year cardiovascular risk is low but LDL meets guidelines to start a cholesterol lowering drug. What are your thoughts?   Marland KitchenMarland KitchenThe 10-year ASCVD risk score (Arnett DK, et al., 2019) is: 0.8%   Values used to calculate the score:     Age: 42 years     Sex: Female     Is Non-Hispanic African American: No     Diabetic: No     Tobacco smoker: No     Systolic Blood Pressure: 114 mmHg     Is BP treated: No     HDL Cholesterol: 72 mg/dL     Total Cholesterol: 319 mg/dL

## 2023-07-31 DIAGNOSIS — K5180 Other ulcerative colitis without complications: Secondary | ICD-10-CM | POA: Diagnosis not present

## 2023-07-31 DIAGNOSIS — R194 Change in bowel habit: Secondary | ICD-10-CM | POA: Diagnosis not present

## 2023-08-29 ENCOUNTER — Encounter: Payer: Self-pay | Admitting: Physician Assistant

## 2023-08-29 DIAGNOSIS — F411 Generalized anxiety disorder: Secondary | ICD-10-CM

## 2023-08-29 DIAGNOSIS — Z789 Other specified health status: Secondary | ICD-10-CM

## 2023-08-29 DIAGNOSIS — F331 Major depressive disorder, recurrent, moderate: Secondary | ICD-10-CM

## 2023-09-02 MED ORDER — NORETHIN ACE-ETH ESTRAD-FE 1.5-30 MG-MCG PO TABS: 1.0000 | ORAL_TABLET | Freq: Every day | ORAL | 1 refills | Status: DC

## 2023-09-02 MED ORDER — BUPROPION HCL ER (XL) 300 MG PO TB24: 300.0000 mg | ORAL_TABLET | Freq: Every day | ORAL | 2 refills | Status: DC

## 2023-09-02 MED ORDER — VENLAFAXINE HCL ER 150 MG PO CP24: 150.0000 mg | ORAL_CAPSULE | Freq: Every day | ORAL | 3 refills | Status: DC

## 2023-09-15 DIAGNOSIS — R194 Change in bowel habit: Secondary | ICD-10-CM | POA: Diagnosis not present

## 2023-09-15 DIAGNOSIS — M6289 Other specified disorders of muscle: Secondary | ICD-10-CM | POA: Diagnosis not present

## 2023-09-15 DIAGNOSIS — N816 Rectocele: Secondary | ICD-10-CM | POA: Diagnosis not present

## 2023-11-09 DIAGNOSIS — M6289 Other specified disorders of muscle: Secondary | ICD-10-CM | POA: Diagnosis not present

## 2023-11-09 DIAGNOSIS — N816 Rectocele: Secondary | ICD-10-CM | POA: Diagnosis not present

## 2023-11-19 ENCOUNTER — Other Ambulatory Visit: Payer: Self-pay | Admitting: Physician Assistant

## 2023-11-19 DIAGNOSIS — Z1231 Encounter for screening mammogram for malignant neoplasm of breast: Secondary | ICD-10-CM

## 2023-12-23 ENCOUNTER — Encounter (HOSPITAL_COMMUNITY): Payer: Self-pay

## 2023-12-30 ENCOUNTER — Ambulatory Visit

## 2023-12-30 DIAGNOSIS — Z1231 Encounter for screening mammogram for malignant neoplasm of breast: Secondary | ICD-10-CM

## 2024-01-01 ENCOUNTER — Ambulatory Visit: Payer: Self-pay | Admitting: Physician Assistant

## 2024-01-01 NOTE — Progress Notes (Signed)
 Normal mammogram. Follow up in 1 year.

## 2024-01-29 DIAGNOSIS — K5180 Other ulcerative colitis without complications: Secondary | ICD-10-CM | POA: Diagnosis not present

## 2024-01-29 DIAGNOSIS — K5900 Constipation, unspecified: Secondary | ICD-10-CM | POA: Diagnosis not present

## 2024-02-01 DIAGNOSIS — K5180 Other ulcerative colitis without complications: Secondary | ICD-10-CM | POA: Diagnosis not present

## 2024-03-12 ENCOUNTER — Other Ambulatory Visit: Payer: Self-pay | Admitting: Physician Assistant

## 2024-03-12 DIAGNOSIS — Z789 Other specified health status: Secondary | ICD-10-CM

## 2024-05-22 ENCOUNTER — Other Ambulatory Visit: Payer: Self-pay | Admitting: Physician Assistant

## 2024-05-22 DIAGNOSIS — F411 Generalized anxiety disorder: Secondary | ICD-10-CM

## 2024-05-22 DIAGNOSIS — F331 Major depressive disorder, recurrent, moderate: Secondary | ICD-10-CM

## 2024-07-04 ENCOUNTER — Other Ambulatory Visit: Payer: Self-pay | Admitting: Physician Assistant

## 2024-07-04 DIAGNOSIS — F411 Generalized anxiety disorder: Secondary | ICD-10-CM

## 2024-07-04 DIAGNOSIS — F331 Major depressive disorder, recurrent, moderate: Secondary | ICD-10-CM

## 2024-07-08 ENCOUNTER — Ambulatory Visit
Admission: EM | Admit: 2024-07-08 | Discharge: 2024-07-08 | Disposition: A | Discharge status: Home / Self Care | Attending: Family Medicine | Admitting: Family Medicine

## 2024-07-08 DIAGNOSIS — L249 Irritant contact dermatitis, unspecified cause: Secondary | ICD-10-CM

## 2024-07-08 MED ORDER — DOXYCYCLINE HYCLATE 100 MG PO CAPS: ORAL_CAPSULE | ORAL | 0 refills | Status: DC

## 2024-07-08 MED ORDER — PREDNISONE 20 MG PO TABS: ORAL_TABLET | ORAL | 0 refills | Status: DC

## 2024-07-08 NOTE — ED Provider Notes (Signed)
 [facility]    CSN: [id number] Arrival date & time: 07/08/24  0808      History   Chief Complaint No chief complaint on file.   HPI Adriana Frazier is a 43 y.o. female.   Patient complains of onset of a small pruritic lesion on her left arm four days ago after she had been doing yard work.  She subsequently develop more pruritic spots on both forearms, but not elsewhere.  She feels well otherwise.  She has been taking PO Benadryl  for itching.  The history is provided by the patient.  Rash Location: forearms. Quality: dryness, itchiness and redness   Quality: not blistering, not burning, not draining, not painful, not peeling, not scaling and not swelling   Severity:  Mild Onset quality:  Sudden Duration:  4 days Timing:  Constant Progression:  Worsening Chronicity:  New Context: plant contact   Relieved by:  Antihistamines Worsened by:  Nothing Ineffective treatments:  None tried Associated symptoms: no abdominal pain, no fatigue, no fever, no induration, no joint pain, no myalgias and no sore throat     Past Medical History:  Diagnosis Date   Anxiety    Depression    Ulcerative colitis (HCC)     Patient Active Problem List   Diagnosis Date Noted   Colon polyp 02/17/2023   Weight gain 12/03/2022   Moderate episode of recurrent major depressive disorder (HCC) 01/24/2022   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   Chronic idiopathic constipation 01/04/2021   Hemorrhoids 05/31/2019   Class 1 obesity due to excess calories without serious comorbidity with body mass index (BMI) of 33.0 to 33.9 in adult 07/25/2018   Uses birth control 07/25/2018   Crohn's ileocolitis (HCC) 08/29/2016   Hyperlipidemia 06/23/2016   Depression 08/04/2014   Generalized anxiety disorder 08/04/2014   Ulcerative colitis (HCC) 08/04/2014   Vitamin D  insufficiency 11/02/2013    Past Surgical History:  Procedure Laterality Date   COLONOSCOPY     cyst in neck injected  with radiation     SIGMOIDOSCOPY      OB History     Gravida  2   Para  2   Term  1   Preterm  1   AB      Living  1      SAB      IAB      Ectopic      Multiple  0   Live Births  1            Home Medications    Prior to Admission medications   Medication Sig Start Date End Date Taking? Authorizing Provider  buPROPion  (WELLBUTRIN  XL) 300 MG 24 hr tablet Take 1 tablet (300 mg total) by mouth daily. 07/04/24  Yes Breeback, Jade L, PA-C  Cholecalciferol (VITAMIN D -3) 5000 units TABS Take 5,000 Units by mouth daily.    Yes [provider]  doxycycline  (VIBRAMYCIN ) 100 MG capsule Take one cap PO Q12hr with food. 07/08/24  Yes Adriana Garnette LABOR, MD  HAILEY FE 1.5/30 1.5-30 MG-MCG tablet Take 1 tablet by mouth daily. 03/14/24  Yes Breeback, Jade L, PA-C  mesalamine  (LIALDA ) 1.2 g EC tablet Take 2 tablets (2.4 g total) by mouth daily with breakfast. 2 tabs Patient taking differently: Take 2.4 g by mouth daily with breakfast. Take 4 tablets daily 09/05/22  Yes Breeback, Jade L, PA-C  Omega 3-6-9 Fatty Acids (OMEGA 3-6-9 PO) Take 1,600 mg by mouth daily.   Yes [provider]  predniSONE  (DELTASONE ) 20 MG tablet Take one tab by mouth twice daily for 4 days, then one daily for 3 days. Take with food. 07/08/24  Yes Adriana Garnette LABOR, MD  venlafaxine  XR (EFFEXOR -XR) 150 MG 24 hr capsule Take 1 capsule (150 mg total) by mouth daily. 09/02/23  Yes Breeback, Jade L, PA-C  CRANBERRY EXTRACT PO Take by mouth.    [provider]    Family History Family History  Problem Relation Age of Onset   Rheum arthritis Mother    Depression Mother    Hyperlipidemia Father    Bipolar disorder Father    Bipolar disorder Sister    Bipolar disorder Paternal Uncle    Colon cancer Other        grandparents.    Breast cancer Maternal Grandmother     Social History Social History   Tobacco Use   Smoking status: Never   Smokeless tobacco: Never  Substance Use  Topics   Alcohol use: Yes    Alcohol/week: 0.0 standard drinks of alcohol    Comment: rarely.    Drug use: No     Allergies   Shellfish allergy   Review of Systems Review of Systems  Constitutional:  Negative for activity change, appetite change, chills, diaphoresis, fatigue and fever.  HENT:  Negative for sore throat.   Gastrointestinal:  Negative for abdominal pain.  Musculoskeletal:  Negative for arthralgias and myalgias.  Skin:  Positive for rash.  All other systems reviewed and are negative.    Physical Exam Triage Vital Signs ED Triage Vitals  Encounter Vitals Group     BP 07/08/24 0822 117/83     Girls Systolic BP Percentile --      Girls Diastolic BP Percentile --      Boys Systolic BP Percentile --      Boys Diastolic BP Percentile --      Pulse Rate 07/08/24 0822 76     Resp 07/08/24 0822 16     Temp 07/08/24 0822 98.2 F (36.8 C)     Temp Source 07/08/24 0822 Oral     SpO2 07/08/24 0822 97 %     Weight --      Height --      Head Circumference --      Peak Flow --      Pain Score 07/08/24 0817 0     Pain Loc --      Pain Education --      Exclude from Growth Chart --    No data found.  Updated Vital Signs BP 117/83 (BP Location: Right Arm)   Pulse 76   Temp 98.2 F (36.8 C) (Oral)   Resp 16   SpO2 97%   Breastfeeding No   Visual Acuity Right Eye Distance:   Left Eye Distance:   Bilateral Distance:    Right Eye Near:   Left Eye Near:    Bilateral Near:     Physical Exam Vitals and nursing note reviewed.  Constitutional:      General: She is not in acute distress. HENT:     Head: Normocephalic.     Mouth/Throat:     Pharynx: Oropharynx is clear.  Eyes:     Pupils: Pupils are equal, round, and reactive to light.  Cardiovascular:     Rate and Rhythm: Normal rate.  Pulmonary:     Effort: Pulmonary effort is normal.  Musculoskeletal:       Arms:  Right lower leg: No edema.     Left lower leg: No edema.     Comments:  Scattered crusted lesions with surrounding erythema primarily on right forearm.  No swelling or tenderness to palpation.  No vesicles, induration, or fluctuance. Several lesions are linear.  Skin:    General: Skin is warm and dry.     Findings: Rash present.  Neurological:     Mental Status: She is alert.      UC Treatments / Results  Labs (all labs ordered are listed, but only abnormal results are displayed) Labs Reviewed - No data to display  EKG   Radiology No results found.  Procedures Procedures (including critical care time)  Medications Ordered in UC Medications - No data to display  Initial Impression / Assessment and Plan / UC Course  I have reviewed the triage vital signs and the nursing notes.  Pertinent labs & imaging results that were available during my care of the patient were reviewed by me and considered in my medical decision making (see chart for details).    Most likely contact dermatitis resulting from plant contact.  Begin prednisone  burst/taper. Suspect early developing cellulitis.  Begin doxycycline  100mg  Q12hr. Followup with Family Doctor if not improved in one week.   Final Clinical Impressions(s) / UC Diagnoses   Final diagnoses:  Irritant contact dermatitis, unspecified trigger     Discharge Instructions      May apply 1% hydrocortisone  cream 2 or 3 times daily as needed for itching.  May take Benadryl  at bedtime if needed.       ED Prescriptions     Medication Sig Dispense Auth. Provider   doxycycline  (VIBRAMYCIN ) 100 MG capsule Take one cap PO Q12hr with food. 14 capsule Adriana Garnette LABOR, MD   predniSONE  (DELTASONE ) 20 MG tablet Take one tab by mouth twice daily for 4 days, then one daily for 3 days. Take with food. 11 tablet Adriana Garnette LABOR, MD         Adriana Garnette LABOR, MD 07/09/24 [phone]

## 2024-07-08 NOTE — ED Triage Notes (Signed)
 Pt presents to UC for c/o rash on bilateral arms x4 days. She states it is itchy but not painful. She has not tried creams but took benadryl  PO for itch relief. She states she's been doing yard work recently.

## 2024-07-08 NOTE — Discharge Instructions (Signed)
 May apply 1% hydrocortisone  cream 2 or 3 times daily as needed for itching.  May take Benadryl  at bedtime if needed.

## 2024-07-14 ENCOUNTER — Telehealth: Admitting: Family Medicine

## 2024-07-14 DIAGNOSIS — L309 Dermatitis, unspecified: Secondary | ICD-10-CM

## 2024-07-15 ENCOUNTER — Other Ambulatory Visit: Payer: Self-pay

## 2024-07-15 ENCOUNTER — Ambulatory Visit
Admission: EM | Admit: 2024-07-15 | Discharge: 2024-07-15 | Disposition: A | Discharge status: Home / Self Care | Attending: Internal Medicine | Admitting: Internal Medicine

## 2024-07-15 DIAGNOSIS — R21 Rash and other nonspecific skin eruption: Secondary | ICD-10-CM

## 2024-07-15 MED ORDER — PREDNISONE 10 MG (21) PO TBPK: ORAL_TABLET | Freq: Every day | ORAL | 0 refills | Status: DC

## 2024-07-15 MED ORDER — TRIAMCINOLONE ACETONIDE 0.1 % EX CREA: 1.0000 | TOPICAL_CREAM | Freq: Two times a day (BID) | CUTANEOUS | 0 refills | Status: DC

## 2024-07-15 NOTE — ED Provider Notes (Signed)
 [facility]    CSN: [id number] Arrival date & time: 07/15/24  1045      History   Chief Complaint Chief Complaint  Patient presents with   Rash    HPI Adriana Frazier is a 43 y.o. female.   Patient presents with persistent itchy rash to bilateral forearms.  She was seen on 07/08/2024 for the same rash and was prescribed doxycycline  and 4 days of prednisone .  She took the medications completely with no improvement in rash.  Reports that she has tried an Aveeno topical lotion with minimal improvement.  She was working outside prior to rash starting but she is not sure of any obvious trigger.  She is not reporting any fevers.   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   Chronic idiopathic constipation 01/04/2021   Hemorrhoids 05/31/2019   Class 1 obesity due to excess calories without serious comorbidity with body mass index (BMI) of 33.0 to 33.9 in adult 07/25/2018   Uses birth control 07/25/2018   Crohn's ileocolitis (HCC) 08/29/2016   Hyperlipidemia 06/23/2016   Depression 08/04/2014   Generalized anxiety disorder 08/04/2014   Ulcerative colitis (HCC) 08/04/2014   Vitamin D  insufficiency 11/02/2013    Past Surgical History:  Procedure Laterality Date   COLONOSCOPY     cyst in neck injected with radiation     SIGMOIDOSCOPY      OB History     Gravida  2   Para  2   Term  1   Preterm  1   AB      Living  1      SAB      IAB      Ectopic      Multiple  0   Live Births  1            Home Medications    Prior to Admission medications   Medication Sig Start Date End Date Taking? Authorizing Provider  predniSONE  (STERAPRED UNI-PAK 21 TAB) 10  MG (21) TBPK tablet Take by mouth daily. Take 6 tabs by mouth daily  for 2 days, then 5 tabs for 2 days, then 4 tabs for 2 days, then 3 tabs for 2 days, 2 tabs for 2 days, then 1 tab by mouth daily for 2 days 07/15/24  Yes Aspinwall, Fritch E, FNP  triamcinolone  cream (KENALOG ) 0.1 % Apply 1 Application topically 2 (two) times daily. 07/15/24  Yes Nailyn Dearinger, Darryle E, FNP  buPROPion  (WELLBUTRIN  XL) 300 MG 24 hr tablet Take 1 tablet (300 mg total) by mouth daily. 07/04/24   Breeback, Jade L, PA-C  Cholecalciferol (VITAMIN D -3) 5000 units TABS Take 5,000 Units by mouth daily.     [provider]  CRANBERRY EXTRACT PO Take by mouth.    [provider]  doxycycline  (VIBRAMYCIN ) 100 MG capsule Take one cap PO Q12hr with food. 07/08/24   Pauline Garnette LABOR, MD  HAILEY FE 1.5/30 1.5-30 MG-MCG tablet Take 1 tablet by mouth daily. 03/14/24   Breeback, Jade L, PA-C  mesalamine  (LIALDA ) 1.2 g EC tablet Take 2 tablets (2.4 g total) by mouth daily with breakfast. 2 tabs Patient taking differently: Take  2.4 g by mouth daily with breakfast. Take 4 tablets daily 09/05/22   Breeback, Jade L, PA-C  Omega 3-6-9 Fatty Acids (OMEGA 3-6-9 PO) Take 1,600 mg by mouth daily.    [provider]  venlafaxine  XR (EFFEXOR -XR) 150 MG 24 hr capsule Take 1 capsule (150 mg total) by mouth daily. 09/02/23   Antoniette Vermell CROME, PA-C    Family History Family History  Problem Relation Age of Onset   Rheum arthritis Mother    Depression Mother    Hyperlipidemia Father    Bipolar disorder Father    Bipolar disorder Sister    Bipolar disorder Paternal Uncle    Colon cancer Other        grandparents.    Breast cancer Maternal Grandmother     Social History Social History   Tobacco Use   Smoking status: Never   Smokeless tobacco: Never  Substance Use Topics   Alcohol use: Yes    Alcohol/week: 0.0 standard drinks of alcohol    Comment: rarely.    Drug use: No     Allergies   Patient has no known  allergies.   Review of Systems Review of Systems Per HPI  Physical Exam Triage Vital Signs ED Triage Vitals  Encounter Vitals Group     BP 07/15/24 1154 129/86     Girls Systolic BP Percentile --      Girls Diastolic BP Percentile --      Boys Systolic BP Percentile --      Boys Diastolic BP Percentile --      Pulse Rate 07/15/24 1154 82     Resp 07/15/24 1154 16     Temp 07/15/24 1154 98.6 F (37 C)     Temp src --      SpO2 07/15/24 1154 97 %     Weight --      Height --      Head Circumference --      Peak Flow --      Pain Score 07/15/24 1158 0     Pain Loc --      Pain Education --      Exclude from Growth Chart --    No data found.  Updated Vital Signs BP 129/86   Pulse 82   Temp 98.6 F (37 C)   Resp 16   LMP 07/11/2024 (Exact Date)   SpO2 97%   Visual Acuity Right Eye Distance:   Left Eye Distance:   Bilateral Distance:    Right Eye Near:   Left Eye Near:    Bilateral Near:     Physical Exam Constitutional:      General: She is not in acute distress.    Appearance: Normal appearance. She is not toxic-appearing or diaphoretic.  HENT:     Head: Normocephalic and atraumatic.  Eyes:     Extraocular Movements: Extraocular movements intact.     Conjunctiva/sclera: Conjunctivae normal.  Pulmonary:     Effort: Pulmonary effort is normal.  Skin:    Comments: Scattered, raised clusters that are erythematous throughout forearms.  There is no honey crusting or drainage noted.  Neurological:     General: No focal deficit present.     Mental Status: She is alert and oriented to person, place, and time. Mental status is at baseline.  Psychiatric:        Mood and Affect: Mood normal.        Behavior: Behavior normal.  Thought Content: Thought content normal.        Judgment: Judgment normal.      UC Treatments / Results  Labs (all labs ordered are listed, but only abnormal results are displayed) Labs Reviewed - No data to  display  EKG   Radiology No results found.  Procedures Procedures (including critical care time)  Medications Ordered in UC Medications - No data to display  Initial Impression / Assessment and Plan / UC Course  I have reviewed the triage vital signs and the nursing notes.  Pertinent labs & imaging results that were available during my care of the patient were reviewed by me and considered in my medical decision making (see chart for details).     Rash is most consistent with contact dermatitis.  It does appear to be consistent with possible poison ivy versus poison oak.  Physical exam is not consistent with bacterial, fungal, viral infection.  Patient was prescribed triamcinolone  cream.  I also think that patient may benefit from a longer course and higher dose of prednisone  to see if this will be helpful.  Advised patient that if rash does not improve or if it worsens to follow-up with provided contact information for dermatology.  Advised strict return precautions.  Patient verbalized understanding and was agreeable with plan. Final Clinical Impressions(s) / UC Diagnoses   Final diagnoses:  Rash     Discharge Instructions      I have prescribed you a topical cream as well as an additional prednisone  course.  Please follow-up with dermatology if symptoms persist or worsen.    ED Prescriptions     Medication Sig Dispense Auth. Provider   predniSONE  (STERAPRED UNI-PAK 21 TAB) 10 MG (21) TBPK tablet Take by mouth daily. Take 6 tabs by mouth daily  for 2 days, then 5 tabs for 2 days, then 4 tabs for 2 days, then 3 tabs for 2 days, 2 tabs for 2 days, then 1 tab by mouth daily for 2 days 42 tablet [place], [place] E, [place]   triamcinolone  cream (KENALOG ) 0.1 % Apply 1 Application topically 2 (two) times daily. 30 g Hazen Darryle BRAVO, [place]      PDMP not reviewed this encounter.   Hazen Darryle B[place]VO, [place] 07/15/24 1314

## 2024-07-15 NOTE — Discharge Instructions (Signed)
 I have prescribed you a topical cream as well as an additional prednisone  course.  Please follow-up with dermatology if symptoms persist or worsen.

## 2024-07-15 NOTE — ED Triage Notes (Addendum)
 Red itchy rash to right arm, seen last week. Has been using hydrocortisone  cream, aveeno eczema lotion.

## 2024-07-15 NOTE — Progress Notes (Signed)
 Patient was seen in urgent care a week ago with similar symptoms and was prescribed steroids and doxycyline and still seems to have the same rash.  This seems to be an ongoing concern and I will ask the patient to be seen in person again given the length of symptoms.    Because this seems to be an ongoing issue and you were already treated with steroids, antibiotics and creams and it has not improved, I feel your condition warrants further evaluation and I recommend that you be seen in a face-to-face visit.   NOTE: There will be NO CHARGE for this E-Visit   If you are having a true medical emergency, please call 911.     For an urgent face to face visit, [facility] has multiple urgent care centers for your convenience.  Click the link below for the full list of locations and hours, walk-in wait times, appointment scheduling options and driving directions:  Urgent Care - [facility], [place], [place], [place], [place], [place]  [place]     Your MyChart E-visit questionnaire answers were reviewed by a board certified advanced clinical practitioner to complete your personal care plan based on your specific symptoms.    Thank you for using e-Visits.   I have spent 5 minutes in review of e-visit questionnaire, review and updating patient chart, medical decision making and response to patient.   Roosvelt Mater, PA-C

## 2024-08-03 ENCOUNTER — Other Ambulatory Visit: Payer: Self-pay | Admitting: Physician Assistant

## 2024-08-03 DIAGNOSIS — F331 Major depressive disorder, recurrent, moderate: Secondary | ICD-10-CM

## 2024-08-03 DIAGNOSIS — F411 Generalized anxiety disorder: Secondary | ICD-10-CM

## 2024-08-05 ENCOUNTER — Encounter: Payer: Self-pay | Admitting: Medical-Surgical

## 2024-08-05 ENCOUNTER — Ambulatory Visit: Admitting: Medical-Surgical

## 2024-08-05 VITALS — BP 113/77 | HR 80 | Temp 99.0°F | Resp 20 | Ht 63.0 in | Wt 201.6 lb

## 2024-08-05 DIAGNOSIS — J329 Chronic sinusitis, unspecified: Secondary | ICD-10-CM | POA: Diagnosis not present

## 2024-08-05 DIAGNOSIS — J40 Bronchitis, not specified as acute or chronic: Secondary | ICD-10-CM | POA: Diagnosis not present

## 2024-08-05 MED ORDER — METHYLPREDNISOLONE 4 MG PO TBPK: ORAL_TABLET | ORAL | 0 refills | Status: AC

## 2024-08-05 MED ORDER — AZITHROMYCIN 250 MG PO TABS: ORAL_TABLET | ORAL | 0 refills | Status: AC

## 2024-08-05 MED ORDER — HYDROCODONE BIT-HOMATROP MBR 5-1.5 MG/5ML PO SOLN: 5.0000 mL | Freq: Three times a day (TID) | ORAL | 0 refills | Status: AC | PRN

## 2024-08-05 NOTE — Progress Notes (Unsigned)
" ° °       Established patient visit   History of Present Illness   Discussed the use of AI scribe software for clinical note transcription with the patient, who gave verbal consent to proceed.  History of Present Illness          Physical Exam   Physical Exam Assessment & Plan   Problem List Items Addressed This Visit   None Visit Diagnoses       Sinobronchitis    -  Primary   Relevant Medications   azithromycin  (ZITHROMAX ) 250 MG tablet   methylPREDNISolone (MEDROL DOSEPAK) 4 MG TBPK tablet   HYDROcodone bit-homatropine (HYCODAN) 5-1.5 MG/5ML syrup      Assessment and Plan             Follow up   Return if symptoms worsen or fail to improve. __________________________________ Zada FREDRIK Palin, DNP, APRN, FNP-BC Primary Care and Sports Medicine [facility] [place] "

## 2024-08-23 ENCOUNTER — Other Ambulatory Visit: Payer: Self-pay | Admitting: Physician Assistant

## 2024-08-23 DIAGNOSIS — F331 Major depressive disorder, recurrent, moderate: Secondary | ICD-10-CM

## 2024-08-23 DIAGNOSIS — F411 Generalized anxiety disorder: Secondary | ICD-10-CM

## 2024-09-05 ENCOUNTER — Other Ambulatory Visit: Payer: Self-pay | Admitting: Physician Assistant

## 2024-09-05 DIAGNOSIS — F331 Major depressive disorder, recurrent, moderate: Secondary | ICD-10-CM

## 2024-09-05 DIAGNOSIS — F411 Generalized anxiety disorder: Secondary | ICD-10-CM

## 2024-09-05 DIAGNOSIS — Z789 Other specified health status: Secondary | ICD-10-CM

## 2024-09-06 ENCOUNTER — Other Ambulatory Visit: Payer: Self-pay | Admitting: Physician Assistant

## 2024-09-06 DIAGNOSIS — Z789 Other specified health status: Secondary | ICD-10-CM

## 2024-09-14 ENCOUNTER — Encounter: Payer: Self-pay | Admitting: Physician Assistant

## 2024-09-14 DIAGNOSIS — Z789 Other specified health status: Secondary | ICD-10-CM

## 2024-09-14 DIAGNOSIS — F331 Major depressive disorder, recurrent, moderate: Secondary | ICD-10-CM

## 2024-09-14 DIAGNOSIS — F411 Generalized anxiety disorder: Secondary | ICD-10-CM

## 2024-09-15 NOTE — Telephone Encounter (Signed)
 Requesting rx rf of  HAILEY FE 1.5/30 1.5-30 MG-MCG tablet  Last written 03/14/2024 And  buPROPion  (WELLBUTRIN  XL) 300 MG 24 hr tablet  Last written 08/05/2024 Patient has been out of medication x one week  Requesting rx rf can be seen  Patient was scheduled for yearly physical for 09/27/2024 Last OV 12/19/225 sick visit  05/25/2023  OV with Vermell Bologna

## 2024-09-16 MED ORDER — NORETHIN ACE-ETH ESTRAD-FE 1.5-30 MG-MCG PO TABS: 1.0000 | ORAL_TABLET | Freq: Every day | ORAL | 1 refills | Status: AC

## 2024-09-16 MED ORDER — BUPROPION HCL ER (XL) 300 MG PO TB24: 300.0000 mg | ORAL_TABLET | Freq: Every day | ORAL | 0 refills | Status: AC

## 2024-09-27 ENCOUNTER — Encounter: Admitting: Physician Assistant
